# Patient Record
Sex: Male | Born: 1965 | Race: White | Hispanic: No | Marital: Married | Smoking: Never smoker
Health system: Southern US, Community
[De-identification: ages and names within clinical notes are randomized; demographics above are authoritative.]

## PROBLEM LIST (undated history)

## (undated) DIAGNOSIS — I10 Essential (primary) hypertension: Secondary | ICD-10-CM

## (undated) DIAGNOSIS — K589 Irritable bowel syndrome without diarrhea: Secondary | ICD-10-CM

## (undated) DIAGNOSIS — F449 Dissociative and conversion disorder, unspecified: Secondary | ICD-10-CM

## (undated) DIAGNOSIS — M722 Plantar fascial fibromatosis: Secondary | ICD-10-CM

## (undated) DIAGNOSIS — G4730 Sleep apnea, unspecified: Secondary | ICD-10-CM

## (undated) DIAGNOSIS — K219 Gastro-esophageal reflux disease without esophagitis: Secondary | ICD-10-CM

## (undated) DIAGNOSIS — F329 Major depressive disorder, single episode, unspecified: Secondary | ICD-10-CM

## (undated) DIAGNOSIS — K5792 Diverticulitis of intestine, part unspecified, without perforation or abscess without bleeding: Secondary | ICD-10-CM

## (undated) DIAGNOSIS — E785 Hyperlipidemia, unspecified: Secondary | ICD-10-CM

## (undated) DIAGNOSIS — I499 Cardiac arrhythmia, unspecified: Secondary | ICD-10-CM

## (undated) DIAGNOSIS — R55 Syncope and collapse: Secondary | ICD-10-CM

## (undated) DIAGNOSIS — G43909 Migraine, unspecified, not intractable, without status migrainosus: Secondary | ICD-10-CM

## (undated) DIAGNOSIS — J45909 Unspecified asthma, uncomplicated: Secondary | ICD-10-CM

## (undated) DIAGNOSIS — F419 Anxiety disorder, unspecified: Secondary | ICD-10-CM

## (undated) DIAGNOSIS — F32A Depression, unspecified: Secondary | ICD-10-CM

## (undated) HISTORY — DX: Plantar fascial fibromatosis: M72.2

## (undated) HISTORY — DX: Diverticulitis of intestine, part unspecified, without perforation or abscess without bleeding: K57.92

## (undated) HISTORY — PX: GALLBLADDER SURGERY: SHX652

## (undated) HISTORY — DX: Syncope and collapse: R55

## (undated) HISTORY — DX: Major depressive disorder, single episode, unspecified: F32.9

## (undated) HISTORY — DX: Anxiety disorder, unspecified: F41.9

## (undated) HISTORY — PX: COLON SURGERY: SHX602

## (undated) HISTORY — DX: Dissociative and conversion disorder, unspecified: F44.9

## (undated) HISTORY — DX: Essential (primary) hypertension: I10

## (undated) HISTORY — DX: Depression, unspecified: F32.A

## (undated) HISTORY — DX: Sleep apnea, unspecified: G47.30

## (undated) HISTORY — PX: URETHRA SURGERY: SHX824

## (undated) HISTORY — DX: Hyperlipidemia, unspecified: E78.5

## (undated) HISTORY — DX: Migraine, unspecified, not intractable, without status migrainosus: G43.909

---

## 2004-10-16 ENCOUNTER — Ambulatory Visit: Payer: Self-pay | Admitting: Internal Medicine

## 2005-01-20 ENCOUNTER — Emergency Department: Payer: Self-pay | Admitting: Emergency Medicine

## 2005-01-20 ENCOUNTER — Other Ambulatory Visit: Payer: Self-pay

## 2006-01-22 ENCOUNTER — Emergency Department: Payer: Self-pay | Admitting: Emergency Medicine

## 2006-01-27 ENCOUNTER — Emergency Department (HOSPITAL_COMMUNITY): Admission: EM | Admit: 2006-01-27 | Discharge: 2006-01-28 | Payer: Self-pay | Admitting: Emergency Medicine

## 2006-02-14 ENCOUNTER — Ambulatory Visit: Payer: Self-pay | Admitting: Psychiatry

## 2006-03-19 ENCOUNTER — Ambulatory Visit: Payer: Self-pay | Admitting: Psychiatry

## 2006-12-01 ENCOUNTER — Other Ambulatory Visit: Payer: Self-pay

## 2006-12-01 ENCOUNTER — Emergency Department: Payer: Self-pay | Admitting: Emergency Medicine

## 2007-01-08 ENCOUNTER — Other Ambulatory Visit: Payer: Self-pay

## 2007-01-08 ENCOUNTER — Emergency Department: Payer: Self-pay | Admitting: Internal Medicine

## 2007-03-31 ENCOUNTER — Emergency Department: Payer: Self-pay | Admitting: Emergency Medicine

## 2007-03-31 ENCOUNTER — Other Ambulatory Visit: Payer: Self-pay

## 2007-04-03 ENCOUNTER — Emergency Department: Payer: Self-pay | Admitting: Emergency Medicine

## 2007-04-03 ENCOUNTER — Other Ambulatory Visit: Payer: Self-pay

## 2007-04-10 ENCOUNTER — Ambulatory Visit: Payer: Self-pay | Admitting: Family Medicine

## 2007-04-16 ENCOUNTER — Ambulatory Visit: Payer: Self-pay | Admitting: Internal Medicine

## 2007-04-16 DIAGNOSIS — I10 Essential (primary) hypertension: Secondary | ICD-10-CM | POA: Insufficient documentation

## 2007-04-16 DIAGNOSIS — R059 Cough, unspecified: Secondary | ICD-10-CM | POA: Insufficient documentation

## 2007-04-16 DIAGNOSIS — R05 Cough: Secondary | ICD-10-CM

## 2007-06-04 ENCOUNTER — Emergency Department: Payer: Self-pay | Admitting: Emergency Medicine
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
## 2008-02-15 ENCOUNTER — Emergency Department: Payer: Self-pay | Admitting: Emergency Medicine

## 2008-03-05 ENCOUNTER — Emergency Department: Payer: Self-pay

## 2008-03-15 ENCOUNTER — Ambulatory Visit: Payer: Self-pay | Admitting: Gastroenterology

## 2008-03-25 ENCOUNTER — Ambulatory Visit: Payer: Self-pay | Admitting: Surgery

## 2008-03-31 ENCOUNTER — Inpatient Hospital Stay: Payer: Self-pay | Admitting: Surgery

## 2008-04-13 ENCOUNTER — Emergency Department: Payer: Self-pay | Admitting: Emergency Medicine

## 2008-09-27 ENCOUNTER — Observation Stay: Payer: Self-pay | Admitting: *Deleted

## 2008-10-13 ENCOUNTER — Ambulatory Visit: Payer: Self-pay | Admitting: Internal Medicine

## 2009-05-17 ENCOUNTER — Ambulatory Visit: Payer: Self-pay | Admitting: Unknown Physician Specialty

## 2009-05-18 ENCOUNTER — Ambulatory Visit: Payer: Self-pay | Admitting: Unknown Physician Specialty

## 2009-08-29 ENCOUNTER — Emergency Department: Payer: Self-pay | Admitting: Unknown Physician Specialty

## 2010-01-05 ENCOUNTER — Ambulatory Visit: Payer: Self-pay | Admitting: Unknown Physician Specialty

## 2010-01-06 LAB — PATHOLOGY REPORT

## 2010-09-29 ENCOUNTER — Emergency Department: Payer: Self-pay | Admitting: Emergency Medicine

## 2010-12-24 ENCOUNTER — Emergency Department: Payer: Self-pay | Admitting: *Deleted

## 2011-04-30 ENCOUNTER — Ambulatory Visit: Payer: Self-pay | Admitting: Unknown Physician Specialty

## 2011-05-03 ENCOUNTER — Ambulatory Visit: Payer: Self-pay | Admitting: Otolaryngology

## 2011-05-08 ENCOUNTER — Ambulatory Visit: Payer: Self-pay | Admitting: Surgery

## 2011-07-15 ENCOUNTER — Emergency Department: Payer: Self-pay | Admitting: Emergency Medicine

## 2011-11-06 ENCOUNTER — Emergency Department: Payer: Self-pay | Admitting: Emergency Medicine

## 2012-07-21 ENCOUNTER — Emergency Department: Payer: Self-pay | Admitting: Emergency Medicine

## 2012-07-21 LAB — URINALYSIS, COMPLETE
Bilirubin,UR: NEGATIVE
Nitrite: NEGATIVE
Specific Gravity: 1.021 (ref 1.003–1.030)

## 2012-07-21 LAB — BASIC METABOLIC PANEL
Anion Gap: 4 — ABNORMAL LOW (ref 7–16)
BUN: 10 mg/dL (ref 7–18)
Creatinine: 0.9 mg/dL (ref 0.60–1.30)
EGFR (African American): >60
Osmolality: 276 (ref 275–301)
Potassium: 4.1 mmol/L (ref 3.5–5.1)
Sodium: 139 mmol/L (ref 136–145)

## 2012-07-21 LAB — CBC
HCT: 44.7 % (ref 40.0–52.0)
HGB: 15.6 g/dL (ref 13.0–18.0)
MCH: 31.1 pg (ref 26.0–34.0)
MCHC: 34.8 g/dL (ref 32.0–36.0)
Platelet: 243 10*3/uL (ref 150–440)
RDW: 13.8 % (ref 11.5–14.5)

## 2012-12-21 ENCOUNTER — Observation Stay: Payer: Self-pay | Admitting: Internal Medicine

## 2012-12-21 LAB — COMPREHENSIVE METABOLIC PANEL
Albumin: 3.6 g/dL (ref 3.4–5.0)
Anion Gap: 4 — ABNORMAL LOW (ref 7–16)
BUN: 10 mg/dL (ref 7–18)
Calcium, Total: 9.1 mg/dL (ref 8.5–10.1)
Co2: 26 mmol/L (ref 21–32)
EGFR (Non-African Amer.): >60
Glucose: 111 mg/dL — ABNORMAL HIGH (ref 65–99)
SGOT(AST): 37 U/L (ref 15–37)
SGPT (ALT): 51 U/L (ref 12–78)
Sodium: 138 mmol/L (ref 136–145)
Total Protein: 7.4 g/dL (ref 6.4–8.2)

## 2012-12-21 LAB — URINALYSIS, COMPLETE
Bacteria: NONE SEEN
Bilirubin,UR: NEGATIVE
Blood: NEGATIVE
Ketone: NEGATIVE
Protein: NEGATIVE
RBC,UR: 1 /HPF (ref 0–5)
Specific Gravity: 1.016 (ref 1.003–1.030)
Squamous Epithelial: <1
WBC UR: 1 /HPF (ref 0–5)

## 2012-12-21 LAB — CBC
HGB: 14.7 g/dL (ref 13.0–18.0)
MCHC: 34.7 g/dL (ref 32.0–36.0)
MCV: 89 fL (ref 80–100)
RBC: 4.72 10*6/uL (ref 4.40–5.90)
RDW: 13.9 % (ref 11.5–14.5)
WBC: 8.1 10*3/uL (ref 3.8–10.6)

## 2012-12-21 LAB — DRUG SCREEN, URINE
Benzodiazepine, Ur Scrn: NEGATIVE (ref ?–200)
Cannabinoid 50 Ng, Ur ~~LOC~~: NEGATIVE (ref ?–50)
MDMA (Ecstasy)Ur Screen: NEGATIVE (ref ?–500)
Methadone, Ur Screen: NEGATIVE (ref ?–300)
Opiate, Ur Screen: NEGATIVE (ref ?–300)
Tricyclic, Ur Screen: NEGATIVE (ref ?–1000)

## 2012-12-21 LAB — TSH: Thyroid Stimulating Horm: 2.88 u[IU]/mL

## 2012-12-21 LAB — TROPONIN I: Troponin-I: <0.02 ng/mL

## 2012-12-22 LAB — CBC WITH DIFFERENTIAL/PLATELET
Basophil #: 0 10*3/uL (ref 0.0–0.1)
Basophil %: 0.2 %
Eosinophil #: 0 10*3/uL (ref 0.0–0.7)
HCT: 42.5 % (ref 40.0–52.0)
HGB: 14.7 g/dL (ref 13.0–18.0)
Lymphocyte #: 0.9 10*3/uL — ABNORMAL LOW (ref 1.0–3.6)
Lymphocyte %: 6.1 %
MCH: 31 pg (ref 26.0–34.0)
MCHC: 34.5 g/dL (ref 32.0–36.0)
MCV: 90 fL (ref 80–100)
Monocyte #: 0.1 x10 3/mm — ABNORMAL LOW (ref 0.2–1.0)
Neutrophil #: 13.5 10*3/uL — ABNORMAL HIGH (ref 1.4–6.5)
Neutrophil %: 92.8 %
Platelet: 233 10*3/uL (ref 150–440)

## 2012-12-22 LAB — LIPID PANEL
Cholesterol: 186 mg/dL (ref 0–200)
HDL Cholesterol: 51 mg/dL (ref 40–60)
Triglycerides: 96 mg/dL (ref 0–200)
VLDL Cholesterol, Calc: 19 mg/dL (ref 5–40)

## 2012-12-22 LAB — BASIC METABOLIC PANEL
Calcium, Total: 9 mg/dL (ref 8.5–10.1)
Chloride: 104 mmol/L (ref 98–107)
Co2: 24 mmol/L (ref 21–32)
Creatinine: 1.02 mg/dL (ref 0.60–1.30)
EGFR (African American): >60
Osmolality: 273 (ref 275–301)
Potassium: 4.4 mmol/L (ref 3.5–5.1)
Sodium: 135 mmol/L — ABNORMAL LOW (ref 136–145)

## 2012-12-22 LAB — TROPONIN I: Troponin-I: <0.02 ng/mL

## 2012-12-24 LAB — SODIUM: Sodium: 137 mmol/L (ref 136–145)

## 2013-01-01 ENCOUNTER — Encounter: Payer: Self-pay | Admitting: Internal Medicine

## 2013-01-03 ENCOUNTER — Encounter: Payer: Self-pay | Admitting: Internal Medicine

## 2013-02-02 ENCOUNTER — Encounter: Payer: Self-pay | Admitting: Internal Medicine

## 2013-02-28 ENCOUNTER — Emergency Department: Payer: Self-pay | Admitting: Emergency Medicine

## 2013-03-04 ENCOUNTER — Ambulatory Visit: Payer: Self-pay | Admitting: Specialist

## 2013-03-05 ENCOUNTER — Encounter: Payer: Self-pay | Admitting: Internal Medicine

## 2013-04-05 ENCOUNTER — Encounter: Payer: Self-pay | Admitting: Internal Medicine

## 2013-05-03 ENCOUNTER — Encounter: Payer: Self-pay | Admitting: Internal Medicine

## 2013-05-10 ENCOUNTER — Emergency Department: Payer: Self-pay | Admitting: Emergency Medicine

## 2013-05-10 LAB — COMPREHENSIVE METABOLIC PANEL
ANION GAP: 8 (ref 7–16)
Albumin: 3.5 g/dL (ref 3.4–5.0)
Alkaline Phosphatase: 82 U/L
BILIRUBIN TOTAL: 0.4 mg/dL (ref 0.2–1.0)
BUN: 10 mg/dL (ref 7–18)
CHLORIDE: 107 mmol/L (ref 98–107)
Calcium, Total: 8.4 mg/dL — ABNORMAL LOW (ref 8.5–10.1)
Co2: 24 mmol/L (ref 21–32)
Creatinine: 0.99 mg/dL (ref 0.60–1.30)
EGFR (African American): >60
EGFR (Non-African Amer.): >60
GLUCOSE: 133 mg/dL — AB (ref 65–99)
Osmolality: 279 (ref 275–301)
POTASSIUM: 3.4 mmol/L — AB (ref 3.5–5.1)
SGOT(AST): 22 U/L (ref 15–37)
SGPT (ALT): 38 U/L (ref 12–78)
Sodium: 139 mmol/L (ref 136–145)
TOTAL PROTEIN: 7.3 g/dL (ref 6.4–8.2)

## 2013-05-10 LAB — TROPONIN I: Troponin-I: <0.02 ng/mL

## 2013-05-10 LAB — CBC WITH DIFFERENTIAL/PLATELET
Basophil #: 0.1 10*3/uL (ref 0.0–0.1)
Basophil %: 1.2 %
Eosinophil #: 0.3 10*3/uL (ref 0.0–0.7)
Eosinophil %: 3.9 %
HCT: 44 % (ref 40.0–52.0)
HGB: 14.7 g/dL (ref 13.0–18.0)
Lymphocyte #: 2 10*3/uL (ref 1.0–3.6)
Lymphocyte %: 23.5 %
MCH: 29.7 pg (ref 26.0–34.0)
MCHC: 33.3 g/dL (ref 32.0–36.0)
MCV: 89 fL (ref 80–100)
Monocyte #: 0.7 x10 3/mm (ref 0.2–1.0)
Monocyte %: 8.7 %
NEUTROS ABS: 5.4 10*3/uL (ref 1.4–6.5)
NEUTROS PCT: 62.7 %
Platelet: 219 10*3/uL (ref 150–440)
RBC: 4.94 10*6/uL (ref 4.40–5.90)
RDW: 13.4 % (ref 11.5–14.5)
WBC: 8.6 10*3/uL (ref 3.8–10.6)

## 2013-05-10 LAB — PROTIME-INR
INR: 1
Prothrombin Time: 13.4 secs (ref 11.5–14.7)

## 2013-05-10 LAB — APTT: ACTIVATED PTT: 29.2 s (ref 23.6–35.9)

## 2013-06-03 ENCOUNTER — Encounter: Payer: Self-pay | Admitting: Internal Medicine

## 2013-06-12 DIAGNOSIS — E785 Hyperlipidemia, unspecified: Secondary | ICD-10-CM | POA: Insufficient documentation

## 2013-06-12 DIAGNOSIS — M722 Plantar fascial fibromatosis: Secondary | ICD-10-CM | POA: Insufficient documentation

## 2013-06-12 DIAGNOSIS — M545 Low back pain, unspecified: Secondary | ICD-10-CM | POA: Insufficient documentation

## 2013-09-14 DIAGNOSIS — F419 Anxiety disorder, unspecified: Secondary | ICD-10-CM | POA: Insufficient documentation

## 2013-09-14 DIAGNOSIS — F332 Major depressive disorder, recurrent severe without psychotic features: Secondary | ICD-10-CM | POA: Insufficient documentation

## 2013-09-14 DIAGNOSIS — G4730 Sleep apnea, unspecified: Secondary | ICD-10-CM | POA: Insufficient documentation

## 2013-09-22 ENCOUNTER — Emergency Department: Payer: Self-pay | Admitting: Emergency Medicine

## 2013-09-22 LAB — BASIC METABOLIC PANEL
Anion Gap: 8 (ref 7–16)
BUN: 12 mg/dL (ref 7–18)
CALCIUM: 8.7 mg/dL (ref 8.5–10.1)
Chloride: 105 mmol/L (ref 98–107)
Co2: 24 mmol/L (ref 21–32)
Creatinine: 1.06 mg/dL (ref 0.60–1.30)
EGFR (Non-African Amer.): >60
Glucose: 105 mg/dL — ABNORMAL HIGH (ref 65–99)
Osmolality: 274 (ref 275–301)
Potassium: 3.9 mmol/L (ref 3.5–5.1)
Sodium: 137 mmol/L (ref 136–145)

## 2013-09-22 LAB — CBC WITH DIFFERENTIAL/PLATELET
BASOS PCT: 0.8 %
Basophil #: 0.1 10*3/uL (ref 0.0–0.1)
Eosinophil #: 0.2 10*3/uL (ref 0.0–0.7)
Eosinophil %: 2.8 %
HCT: 47.7 % (ref 40.0–52.0)
HGB: 16.3 g/dL (ref 13.0–18.0)
Lymphocyte #: 1.4 10*3/uL (ref 1.0–3.6)
Lymphocyte %: 16.6 %
MCH: 30.7 pg (ref 26.0–34.0)
MCHC: 34.2 g/dL (ref 32.0–36.0)
MCV: 90 fL (ref 80–100)
Monocyte #: 0.6 x10 3/mm (ref 0.2–1.0)
Monocyte %: 7.3 %
NEUTROS PCT: 72.5 %
Neutrophil #: 6.1 10*3/uL (ref 1.4–6.5)
PLATELETS: 212 10*3/uL (ref 150–440)
RBC: 5.31 10*6/uL (ref 4.40–5.90)
RDW: 13.8 % (ref 11.5–14.5)
WBC: 8.4 10*3/uL (ref 3.8–10.6)

## 2013-09-22 LAB — TROPONIN I

## 2013-10-05 ENCOUNTER — Encounter (INDEPENDENT_AMBULATORY_CARE_PROVIDER_SITE_OTHER): Payer: Self-pay

## 2013-10-05 ENCOUNTER — Encounter: Payer: Self-pay | Admitting: Cardiovascular Disease

## 2013-10-05 ENCOUNTER — Ambulatory Visit (INDEPENDENT_AMBULATORY_CARE_PROVIDER_SITE_OTHER): Payer: 59 | Admitting: Cardiovascular Disease

## 2013-10-05 VITALS — BP 120/92 | HR 94 | Ht 72.0 in | Wt 256.8 lb

## 2013-10-05 DIAGNOSIS — R519 Headache, unspecified: Secondary | ICD-10-CM | POA: Insufficient documentation

## 2013-10-05 DIAGNOSIS — F449 Dissociative and conversion disorder, unspecified: Secondary | ICD-10-CM

## 2013-10-05 DIAGNOSIS — R55 Syncope and collapse: Secondary | ICD-10-CM

## 2013-10-05 DIAGNOSIS — R51 Headache: Secondary | ICD-10-CM

## 2013-10-05 DIAGNOSIS — F329 Major depressive disorder, single episode, unspecified: Secondary | ICD-10-CM

## 2013-10-05 DIAGNOSIS — F32A Depression, unspecified: Secondary | ICD-10-CM | POA: Insufficient documentation

## 2013-10-05 DIAGNOSIS — I158 Other secondary hypertension: Secondary | ICD-10-CM

## 2013-10-05 DIAGNOSIS — F3289 Other specified depressive episodes: Secondary | ICD-10-CM

## 2013-10-05 DIAGNOSIS — I10 Essential (primary) hypertension: Secondary | ICD-10-CM

## 2013-10-05 DIAGNOSIS — G44019 Episodic cluster headache, not intractable: Secondary | ICD-10-CM

## 2013-10-05 DIAGNOSIS — F444 Conversion disorder with motor symptom or deficit: Secondary | ICD-10-CM | POA: Insufficient documentation

## 2013-10-05 NOTE — Assessment & Plan Note (Signed)
Suspect his depression may be playing a role in his symptoms. He is on Celexa, Wellbutrin. Seems to have good supports

## 2013-10-05 NOTE — Assessment & Plan Note (Signed)
Suspect his headaches could be associated with underlying depression. Does not seem to have worsening symptoms after his syncope. He has followup with neurology

## 2013-10-05 NOTE — Assessment & Plan Note (Signed)
Details of his acute right-sided weakness unclear from last year in October 2014. 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 Previously seen by Dr. Melrose Nakayama, complete workup done at that time in the hospital. By his report he had MRI done that did not show stroke.

## 2013-10-05 NOTE — Progress Notes (Signed)
Patient ID: Arthur Howe, male    DOB: Jul 30, 1965, 48 y.o.   MRN: [id number]  HPI Comments: Arthur Howe is a pleasant 48 year old gentleman with prior history of diverticulitis with associated near syncope episodes, obstructive sleep apnea who is on CPAP, hypertension, hyperlipidemia, anxiety, depression with periodic migraines who presents for new patient evaluation for syncope.  Notes indicate recent symptoms of poor mood, chronic fatigue, periodic headaches  Get an episode in October 2014 where he had acute onset of right-sided weakness. He was seen by neurology and diagnosed with conversion disorder. He reports having MRI at the time that did not document a stroke. He feels that he continues to have mild residual symptoms.  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 Difficulty started several weeks ago. He used to be 90% accurate with his reading. Recently has been getting scores in the 70s which is unlike him  09/21/2013 reports working in his garden, stood up to walk into the house. Once in the house he had syncope without warning. Found himself on the ground. He did not hurt himself in one particular location, did not hit his head as he had no significant bruising. He called his wife, lie on the floor. Blood pressure is 196/130. He felt weak. Wife helped him to lay on the couch, he took Ativan and slowly his blood pressure improved.  He has general malaise the next day and went to the hospital emergency room July 21. Workup was essentially normal and he was referred to neurology For several days he had fogginess EKG today shows normal sinus rhythm with rate 94 beats per minute, no significant ST or T wave changes   reports having echocardiogram this past year that was reportedly normal [facility] telemetry office that showed systolic pressures 338/25 range, heart rate 90-100 with no significant change with standing. Interestingly he felt  dizzy sitting and standing        Outpatient Encounter Prescriptions as of 10/05/2013  Medication Sig  . albuterol (PROVENTIL HFA;VENTOLIN HFA) 108 (90 BASE) MCG/ACT inhaler Inhale into the lungs every 6 (six) hours as needed for wheezing or shortness of breath.  . ALPRAZolam (XANAX) 0.25 MG tablet Take 0.25 mg by mouth at bedtime as needed for anxiety.  Marland Kitchen buPROPion (WELLBUTRIN SR) 100 MG 12 hr tablet Take 100 mg by mouth 2 (two) times daily.  Marland Kitchen BUTALBITAL-ACETAMINOPHEN PO Take by mouth as needed.  . citalopram (CELEXA) 10 MG tablet Take 10 mg by mouth daily.  . clonazePAM (KLONOPIN) 0.5 MG tablet Take 0.5 mg by mouth 2 (two) times daily as needed for anxiety.  . dicyclomine (BENTYL) 10 MG capsule Take 10 mg by mouth 4 (four) times daily -  before meals and at bedtime.  . fexofenadine (ALLEGRA) 60 MG tablet Take 60 mg by mouth 2 (two) times daily.  Marland Kitchen ibuprofen (ADVIL,MOTRIN) 200 MG tablet Take 400 mg by mouth every 6 (six) hours as needed.  Marland Kitchen losartan (COZAAR) 100 MG tablet Take 100 mg by mouth daily.  . meclizine (ANTIVERT) 25 MG tablet Take 25 mg by mouth 3 (three) times daily.   . mometasone (NASONEX) 50 MCG/ACT nasal spray Place 2 sprays into the nose daily.  . Multiple Vitamin (MULTI VITAMIN DAILY PO) Take by mouth daily.  . nortriptyline (PAMELOR) 10 MG capsule Take 10 mg by mouth at bedtime.  Marland Kitchen omeprazole (PRILOSEC) 20 MG capsule Take 20 mg by mouth daily.  . promethazine (PHENERGAN)  25 MG tablet Take 25 mg by mouth every 6 (six) hours as needed for nausea or vomiting.   Review of Systems  Constitutional: Positive for fever.  HENT: Negative.   Eyes: Negative.   Respiratory: Negative.   Cardiovascular: Negative.   Gastrointestinal: Negative.   Endocrine: Negative.   Musculoskeletal: Negative.   Skin: Negative.   Allergic/Immunologic: Negative.   Neurological: Positive for syncope and weakness.  Hematological: Negative.   Psychiatric/Behavioral: Positive for dysphoric mood  and decreased concentration. The patient is nervous/anxious.   All other systems reviewed and are negative.   BP 120/92  Pulse 94  Ht 6' (1.829 m)  Wt 256 lb 12 oz (116.461 kg)  BMI 34.81 kg/m2  Physical Exam  Nursing note and vitals reviewed. Constitutional: He is oriented to person, place, and time. He appears well-developed and well-nourished.  HENT:  Head: Normocephalic.  Nose: Nose normal.  Mouth/Throat: Oropharynx is clear and moist.  Eyes: Conjunctivae are normal. Pupils are equal, round, and reactive to light.  Neck: Normal range of motion. Neck supple. No JVD present.  Cardiovascular: Normal rate, regular rhythm, S1 normal, S2 normal, normal heart sounds and intact distal pulses.  Exam reveals no gallop and no friction rub.   No murmur heard. Pulmonary/Chest: Effort normal and breath sounds normal. No respiratory distress. He has no wheezes. He has no rales. He exhibits no tenderness.  Abdominal: Soft. Bowel sounds are normal. He exhibits no distension. There is no tenderness.  Musculoskeletal: Normal range of motion. He exhibits no edema and no tenderness.  Lymphadenopathy:    He has no cervical adenopathy.  Neurological: He is alert and oriented to person, place, and time. Coordination normal.  Skin: Skin is warm and dry. No rash noted. No erythema.  Psychiatric: He has a normal mood and affect. His behavior is normal. Judgment and thought content normal.      Assessment and Plan

## 2013-10-05 NOTE — Patient Instructions (Signed)
You are doing well. No medication changes were made.  We will order a 48 hour holter monitor for syncope We will call you with the results  If you have moe syncope/pass out spells, please call the office  Please call us if you have new issues that need to be addressed before your next appt.

## 2013-10-05 NOTE — Assessment & Plan Note (Signed)
Blood pressure is well controlled on today's visit. No changes made to the medications. 

## 2013-10-05 NOTE — Assessment & Plan Note (Addendum)
Hospital records were reviewed from his trip to the emergency room 09/22/2013. Primary care records also reviewed. We'll try to obtain his prior echocardiogram. EKG and clinical exam is essentially benign.  We did obtain a prior stress echocardiogram done in 2009 that showed no ischemia. There was decreased exercise tolerance with asthma-type symptoms. He walked for less than 9 minutes, achieved 10 METS.  Unable to exclude arrhythmia and a 48 hour Holter monitor has been ordered. Symptoms did not suggest ischemia and no further testing has been ordered. If he has additional episodes of near syncope or syncope, a 30 day monitor could be ordered.   Unable to exclude a neurologic issue. He has followup with Dr. Melrose Nakayama.

## 2013-10-07 ENCOUNTER — Telehealth: Payer: Self-pay | Admitting: *Deleted

## 2013-10-07 NOTE — Telephone Encounter (Signed)
Patient called and hasn't received his heart monitor. Please call

## 2013-10-07 NOTE — Telephone Encounter (Signed)
Spoke w/ pt.  He states that he has not heard from [facility] regarding his 48 hr holter.  Gave him # to Margaretha Sheffield in case he has not heard anything tomorrow.

## 2013-10-09 NOTE — Telephone Encounter (Signed)
Spoke with patient regarding [facility] monitor and he will have placed on Monday.

## 2013-10-12 ENCOUNTER — Telehealth: Payer: Self-pay | Admitting: *Deleted

## 2013-10-12 DIAGNOSIS — R55 Syncope and collapse: Secondary | ICD-10-CM

## 2013-10-12 NOTE — Telephone Encounter (Signed)
Heart monitor

## 2013-10-13 ENCOUNTER — Telehealth: Payer: Self-pay

## 2013-10-13 NOTE — Telephone Encounter (Signed)
Pt called states he has a change in symptoms, speech is messed up, and confused.

## 2013-10-13 NOTE — Telephone Encounter (Signed)
Spoke w/ pt.  He states that his BP is 185/110 and his speech has been slurred for the past 2 days. Pt's speech is audilby slow and slurred.  Advised pt to hang up the phone and call 911. He verbalizes understanding and will do so now.

## 2013-10-15 DIAGNOSIS — G43909 Migraine, unspecified, not intractable, without status migrainosus: Secondary | ICD-10-CM | POA: Insufficient documentation

## 2013-10-15 DIAGNOSIS — K219 Gastro-esophageal reflux disease without esophagitis: Secondary | ICD-10-CM | POA: Insufficient documentation

## 2013-10-26 ENCOUNTER — Telehealth: Payer: Self-pay

## 2013-10-26 NOTE — Telephone Encounter (Signed)
Reviewed results of pt's recent holter monitor.  He verbalizes understanding, but would like to know what the next step is.  He asks if he should be referred to another type of specialist or if there are more "heart tests" that can be done.  Please advise.   Thank you.

## 2013-10-26 NOTE — Telephone Encounter (Signed)
Left detailed message on pt's vm w/ holter monitor results:  "NSR, rare APC, PVC"  Asked pt to call back w/ any questions or concerns.

## 2013-10-26 NOTE — Telephone Encounter (Signed)
Notes indicate he had a prior echocardiogram Can we try to obtain that for our records  Would suggest he continue followup with Dr. Jerline Pain of neurology  If he has additional symptoms of near syncope or syncope that are not explained, We could order a 30 day monitor At this point, will need to watch him for additional symptoms. No medication changes needed Would monitor the blood pressure at home

## 2013-10-27 NOTE — Telephone Encounter (Signed)
Spoke w/ pt.  Advised him of Dr. Donivan Scull recommendation.  He verbalizes understanding and will call w/ any further questions or concerns.

## 2013-11-05 ENCOUNTER — Other Ambulatory Visit: Payer: Self-pay

## 2013-11-05 ENCOUNTER — Encounter (INDEPENDENT_AMBULATORY_CARE_PROVIDER_SITE_OTHER): Payer: 59

## 2013-11-05 DIAGNOSIS — R55 Syncope and collapse: Secondary | ICD-10-CM

## 2014-06-25 NOTE — H&P (Signed)
PATIENT NAME:  Arthur Howe, Arthur Howe MR#:  [id number] DATE OF BIRTH:  1965/10/10  DATE OF ADMISSION:  12/21/2012  PRIMARY CARE PHYSICIAN:  Youlanda Roys. Lovie Macadamia, MD  CHIEF COMPLAINT: Right-sided weakness.   HISTORY OF PRESENT ILLNESS: This is a 49 year old man who, on Friday, thought he was having an anxiety attack; had chest pain, shortness of breath.  His neck was hurting and had involuntary spasms, some weakness on the legs, mostly right side. Yesterday, he was walking a little bit better but still had the weakness. He had some slurred speech on Friday night and difficulty getting out his thoughts. Today, still having right-sided weakness. He drove to work. He could not remember the alarm code at work. He could not get his hands to work right. He was typing in wrong numbers, making mistakes mostly with his right hand. Two weeks ago, he was on an antibiotic, an inhaler and a cough medication but no other recent medications. In the ER, he did have right-sided weakness. He had a CT scan of the head that was negative. Hospitalist services were contacted for admission.   PAST MEDICAL HISTORY: Anxiety, hypertension, diverticulitis, sleep apnea.   PAST SURGICAL HISTORY: Cholecystectomy and colon surgery.   ALLERGIES: PENICILLIN.   MEDICATIONS: Still working on that list.  He does take Prozac and a blood pressure medication and a multivitamin.   SOCIAL HISTORY: No smoking. No alcohol. No drug use. Works at [employer].   FAMILY HISTORY: Father with seizures. Mother with diverticulitis. Grandparents with heart disease.   REVIEW OF SYSTEMS: CONSTITUTIONAL: Positive for chills. Positive for sweats. Positive for weight gain, 10 pounds the past month. Positive for fatigue, weakness on the right side.  EYES:  Bilateral eye blurry vision, cannot see close.  EARS, NOSE, MOUTH AND THROAT:  Positive for runny nose. Positive for scratchy throat.  CARDIOVASCULAR: Positive for chest pain on and off for the past 3  days.  RESPIRATORY: Positive for shortness of breath, cough, yellow phlegm.  GASTROINTESTINAL: No nausea. No vomiting. No abdominal pain. No diarrhea. No constipation.  GENITOURINARY: Positive for burning on urination. No hematuria.  MUSCULOSKELETAL: Positive for joint pain all over.  INTEGUMENT: No rashes or eruptions.  NEUROLOGIC: Positive for right-sided weakness, unsteady gait.  PSYCHIATRIC: Positive for anxiety.  ENDOCRINE: No thyroid problems.  HEMATOLOGIC AND LYMPHATIC: No anemia. No easy bruising or bleeding.   PHYSICAL EXAMINATION: VITAL SIGNS: Temperature 98.3, pulse 89, respirations 18, blood pressure 192/107, pulse ox 96% on room air.  GENERAL: No respiratory distress.  EYES: Conjunctivae and lids normal. Pupils equal, round and reactive to light. Extraocular muscles intact. No nystagmus.  EARS, NOSE, MOUTH AND THROAT: Tympanic membranes: No erythema. Nasal mucosa: No erythema. Throat: No erythema. No exudate seen. Lips and gums: No lesions.  NECK: No JVD. No bruits. No lymphadenopathy. No thyromegaly. Positive for paraspinal muscle tenderness bilaterally. No pain over the cervical spine with palpation.  CARDIOVASCULAR: S1, S2 normal. No gallops, rubs or murmurs heard. Carotid upstroke 2+ bilaterally. No bruits. Dorsalis pedis pulses 2+ bilaterally. Trace edema of the lower extremity.  ABDOMEN: Soft, nontender. No organomegaly/splenomegaly. Normoactive bowel sounds. No masses felt.  RESPIRATORY: Positive expiratory wheeze, slight. No use of accessory muscles to breathe. No rhonchi or rales heard.  LYMPHATIC: No lymph nodes in the neck.  MUSCULOSKELETAL: Trace edema. No clubbing. No cyanosis.  SKIN: No ulcers or lesions seen.  NEUROLOGIC: Cranial nerves II through XII grossly intact. Reflexes 2+ bilaterally. Sensation right side to light touch decreased by  my exam. Power 5 out of 5 on the left, 4 out of 5 power right lower extremity and right upper extremity.  PSYCHIATRIC: The  patient is alert and oriented to person, place and time.   LABORATORY AND RADIOLOGICAL DATA: CT scan of the head was negative. White blood cell count 8.1, H and H 14.7 and 42.2, platelet count of 205. Glucose 111, BUN 10, creatinine 0.84, sodium 138, potassium 4.2, chloride 108, CO2 26, calcium 9.1. Liver function tests normal range. Urinalysis negative. TSH 2.88. Troponin negative.   ASSESSMENT AND PLAN: 1.  Right-sided weakness. Will admit as an observation, rule out cerebrovascular accident and brain tumor with an MRI of the brain. Since the patient also had some neck symptoms, I will MRI of the cervical spine also and give empiric steroids. I will give an aspirin 325 mg daily for stroke prevention. I will get physical therapy and occupational therapy evaluations. Continue to monitor based on clinical signs. Further recommendations based on radiological studies.  2.  Cough. The patient had recent antibiotic course. I will start empiric steroids and nebulizers. I will order a chest x-ray to rule out pneumonia.  3.  Malignant hypertension. Will give a stat dose of clonidine and continue to monitor closely.  4.  Sleep apnea. Continue CPAP at night.  5.  Anxiety. We will continue Prozac and the patient's Xanax, just trying to confirm dosages.   TIME SPENT ON ADMISSION: 55 minutes.     ____________________________ Tana Conch. Leslye Peer, MD rjw:cs D: 12/21/2012 14:53:11 ET T: 12/21/2012 15:38:14 ET JOB#: [id number]  cc: Tana Conch. Leslye Peer, MD, <Dictator> Youlanda Roys. Lovie Macadamia, MD Marisue Brooklyn MD ELECTRONICALLY SIGNED 12/22/2012 10:19

## 2014-06-25 NOTE — Consult Note (Signed)
Referring Physician:  Loletha Grayer :   Primary Care Physician:  Loletha Grayer : [facility] [place], [facility], [address] [place], [place] [address], [phone]  Reason for Consult: Admit Date: 21-Dec-2012  Chief Complaint: R sided weakness  Reason for Consult: CVA   History of Present Illness: History of Present Illness:   49 yo RHD M presents to [facility] with new onset of R sided weakness and difficulty getting his words out that started 4 days ago.  Pt went to work 3 days ago and was noted to be typing the wrong things.  He was very confused per wife and unable to get words out.  He did have a period of incontinence.  He still has some R sided weakness and speech is better.  He has never had an episode like this.  there was no tongue biting or shaking episode but there was a period where pt was confused.  Pt has remote hx of head trauma.  ROS:  General denies complaints   HEENT no complaints   Lungs no complaints   Cardiac no complaints   GI nausea   GU no complaints   Musculoskeletal no complaints   Extremities no complaints   Skin no complaints   Neuro numbness/tingling   Endocrine no complaints   Psych anxiety  depression   Past Medical/Surgical Hx:  GERD - Esophageal Reflux:   Gastritis:   Heart Murmer:   MRSA:   Depression:   Anxiety:   Migraines:   IBS:   Diverticulitis:   HTN:   Cholecystectomy:   EGD:   Colonoscopy:   Repair of Urethral Stricture:   Laparoscopic assisted sigmoid colectomy.:   Past Medical/ Surgical Hx:  Past Medical History as above   Past Surgical History as above   Home Medications: Medication Instructions Last Modified Date/Time  Skelaxin 800 mg oral tablet 1 tab(s) orally 3 times a day, As Needed 21-Oct-14 14:02  predniSONE 10 mg oral tablet 5 tab(s) orally once a day x 1 days 4 tab(s) orally once a day x 1 days 3 tab(s) orally once a day x 1 days 2 tab(s) orally once a day x 1  days 1 tab(s) orally once a day x 1 days 21-Oct-14 14:02  lisinopril 5 mg oral tablet 1 tab(s) orally once a day 21-Oct-14 14:02  omeprazole 20 mg oral delayed release capsule 1 cap(s) orally 2 times a day 20-Oct-14 09:48  alprazolam 0.25 mg oral tablet 1 tab(s) orally 2 times a day, As Needed - for Anxiety, Nervousness 20-Oct-14 09:48  FLUoxetine 20 mg oral capsule 1 cap(s) orally once a day 20-Oct-14 09:48  multivitamin 1 tab(s) orally once a day 20-Oct-14 09:48  promethazine 25 mg oral tablet 1 tab(s) orally every 6 hours as needed for nausea, vomiting. 20-Oct-14 09:48  Ventolin HFA CFC free 90 mcg/inh inhalation aerosol 2 puff(s) inhaled 4 times a day as needed for shortness of breath/ wheezing.  20-Oct-14 09:48   Allergies:  PCN: Swelling, Other  Amoxicillin: Swelling  Allergies:  Allergies as above   Social/Family History: Employment Status: currently employed; [employer]  Lives With: spouse  Living Arrangements: house  Social History: no tob, no EtOH, no illicits  Family History: + epilepsy in father, no stroke   Vital Signs: **Vital Signs.:   21-Oct-14 14:08  Vital Signs Type Q 4hr  Temperature Temperature (F) 98.1  Celsius 36.7  Temperature Source oral  Pulse Pulse 86  Respirations Respirations 18  Systolic BP Systolic BP 591  Diastolic BP (mmHg) Diastolic BP (mmHg) 638  Mean BP 121  Pulse Ox % Pulse Ox % 93  Pulse Ox Activity Level  At rest  Oxygen Delivery Room Air/ 21 %   Physical Exam: General: slightly overweight, slightly anxious,  HEENT: normocephalic, sclera nonicteric, oropharynx clear  Neck: supple, no JVD, no bruits  Chest: CTA B, no wheezing, good movement  Cardiac: RRR, no murmurs, no edema, 2+ pulses  Extremities: no C/C/E, FROM   Neurologic Exam: Mental Status: alert and oriented x 3, normal speech and language, follows complex commands  Cranial Nerves: PERRLA, EOMI, nl VF, face symmetric, tongue midline, shoulder shrug equal  Motor  Exam: 5/5 B with mild R UE drift, normal tone, high frequency tremor  Deep Tendon Reflexes: 2+/4 B, plantars downgoing B, no Hoffman  Sensory Exam: decreased pin and temp on R hemibody, no splitting of midline  Coordination: FTN and HTS WNL, nl RAM   Lab Results: Thyroid:  19-Oct-14 10:53   Thyroid Stimulating Hormone 2.88 (0.45-4.50 (International Unit)  ----------------------- Pregnant patients have  different reference  ranges for TSH:  - - - - - - - - - -  Pregnant, first trimetser:  0.36 - 2.50 uIU/mL)  Hepatic:  19-Oct-14 10:53   Bilirubin, Total 0.5  Alkaline Phosphatase 85  SGPT (ALT) 51  SGOT (AST) 37  Total Protein, Serum 7.4  Albumin, Serum 3.6  Routine Chem:  20-Oct-14 02:58   Glucose, Serum  154  BUN 12  Creatinine (comp) 1.02  Sodium, Serum  135  Potassium, Serum 4.4  Chloride, Serum 104  CO2, Serum 24  Calcium (Total), Serum 9.0  Anion Gap 7  Osmolality (calc) 273  eGFR (African American) >60  eGFR (Non-African American) >60 (eGFR values <14m/min/1.73 m2 may be an indication of chronic kidney disease (CKD). Calculated eGFR is useful in patients with stable renal function. The eGFR calculation will not be reliable in acutely ill patients when serum creatinine is changing rapidly. It is not useful in  patients on dialysis. The eGFR calculation may not be applicable to patients at the low and high extremes of body sizes, pregnant women, and vegetarians.)  Cholesterol, Serum 186  Triglycerides, Serum 96  HDL (INHOUSE) 51  VLDL Cholesterol Calculated 19  LDL Cholesterol Calculated  116 (Result(s) reported on 22 Dec 2012 at 04:20AM.)  Urine Drugs:  [id number]  Tricyclic Antidepressant, Ur Qual (comp) NEGATIVE (Result(s) reported on 21 Dec 2012 at 03:35PM.)  Amphetamines, Urine Qual. NEGATIVE  MDMA, Urine Qual. NEGATIVE  Cocaine Metabolite, Urine Qual. NEGATIVE  Opiate, Urine qual NEGATIVE  Phencyclidine, Urine Qual. NEGATIVE  Cannabinoid,  Urine Qual. NEGATIVE  Barbiturates, Urine Qual. NEGATIVE  Benzodiazepine, Urine Qual. NEGATIVE (----------------- The URINE DRUG SCREEN provides only a preliminary, unconfirmed analytical test result and should not be used for non-medical  purposes.  Clinical consideration and professional judgment should be  applied to any positive drug screen result due to possible interfering substances.  A more specific alternate chemical method must be used in order to obtain a confirmed analytical result.  Gas chromatography/mass spectrometry (GC/MS) is the preferred confirmatory method.)  Methadone, Urine Qual. NEGATIVE  Cardiac:  20-Oct-14 02:58   Troponin I < 0.02 (0.00-0.05 0.05 ng/mL or less: NEGATIVE  Repeat testing in 3-6 hrs  if clinically indicated. >0.05 ng/mL: POTENTIAL  MYOCARDIAL INJURY. Repeat  testing in 3-6 hrs if  clinically indicated. NOTE: An increase or decrease  of 30% or more  on serial  testing suggests a  clinically important change)  Routine UA:  19-Oct-14 10:53   Color (UA) Yellow  Clarity (UA) Clear  Glucose (UA) Negative  Bilirubin (UA) Negative  Ketones (UA) Negative  Specific Gravity (UA) 1.016  Blood (UA) Negative  pH (UA) 6.0  Protein (UA) Negative  Nitrite (UA) Negative  Leukocyte Esterase (UA) Negative (Result(s) reported on 21 Dec 2012 at 11:48AM.)  RBC (UA) 1 /HPF  WBC (UA) 1 /HPF  Bacteria (UA) NONE SEEN  Epithelial Cells (UA) <1 /HPF (Result(s) reported on 21 Dec 2012 at 11:48AM.)  Routine Hem:  20-Oct-14 02:58   WBC (CBC)  14.6  RBC (CBC) 4.74  Hemoglobin (CBC) 14.7  Hematocrit (CBC) 42.5  Platelet Count (CBC) 233  MCV 90  MCH 31.0  MCHC 34.5  RDW 13.9  Neutrophil % 92.8  Lymphocyte % 6.1  Monocyte % 0.9  Eosinophil % 0.0  Basophil % 0.2  Neutrophil #  13.5  Lymphocyte #  0.9  Monocyte #  0.1  Eosinophil # 0.0  Basophil # 0.0 (Result(s) reported on 22 Dec 2012 at 04:17AM.)   Radiology Results: CT:    19-Oct-14 11:24, CT  Head Without Contrast  CT Head Without Contrast   REASON FOR EXAM:    right sided weakness 1 day ago  COMMENTS:       PROCEDURE: CT  - CT HEAD WITHOUT CONTRAST  - Dec 21 2012 11:24AM     RESULT: Noncontrast CT is compared to a study of 07/21/2012. The   ventricles and sulci are normal. There is no hemorrhage. There is no   focal mass, mass-effect or midline shift. There is no evidence of edema   or territorial infarct. The bone windows demonstrate normal aeration of   the paranasal sinuses and mastoid air cells. There is no skull fracture   demonstrated.    IMPRESSION:    1. No acute intracranial abnormality. Stable appearance.  Dictation Site: 6        Verified By: Sundra Aland, M.D., MD   Radiology Impression: Radiology Impression: MRI personally reviewed by me and normal brain and mild disc buldges on neck   Impression/Recommendations: Recommendations:   labs reviewed by me notes reviewed by me   Probable complex partial seizure-  given the location of symptoms and persistance without MRI evidence.  Pt also has family hx of such Todd's paralysis-  cause of R sided weakness Headache-  most likely post-ictal Cervical disease-  mild and not cause of above EEG VPA 1gm IV x1, magnesium sulfate 1gm IV x 1, toradol 57m IV x 1 start trileptal 1554mBID no driving or operating heavy machinery x 6 months will follow  Electronic Signatures: SmJamison NeighborMD)  (Signed 21-Oct-14 14:56)  Authored: REFERRING PHYSICIAN, Primary Care Physician, Consult, History of Present Illness, Review of Systems, PAST MEDICAL/SURGICAL HISTORY, HOME MEDICATIONS, ALLERGIES, Social/Family History, NURSING VITAL SIGNS, Physical Exam-, LAB RESULTS, RADIOLOGY RESULTS, Recommendations   Last Updated: 21-Oct-14 14:56 by SmJamison NeighborMD)

## 2014-06-25 NOTE — Discharge Summary (Signed)
PATIENT NAME:  Arthur Howe, Arthur Howe MR#:  [id number] DATE OF BIRTH:  Jul 06, 1965  DATE OF ADMISSION:  12/21/2012 DATE OF DISCHARGE:  12/24/2012  DISCHARGE DIAGNOSES:  1. Complex migraine causing right-sided weakness, now improving.  2. Mild cervical disk disease causing neck and occipital head pain, likely due to muscle spasm.  3. Malignant hypertension and tremor likely due to anxiety.   SECONDARY DIAGNOSES:  1. Anxiety.  2. Hypertension.  3. Diverticulitis.  4. Sleep apnea.   CONSULTATIONS:  1. Neurology, Dr. Valora Corporal.  2. Orthopedics, Dr. Earnestine Leys.   PROCEDURES AND RADIOLOGY: MRI of the brain with and without contrast on the 20th of October showed no acute pathology.   MRI of the cervical spine without contrast on the 20th of October showed cervical spondylosis.   Chest x-ray on the 19th of October showed no evidence of pneumonia or CHF. Subsegmental atelectasis.   CT scan of the head without contrast on the 19th of October showed no acute intracranial pathology.   UA on admission was negative on the 19th of October.   Serum vitamin B12 level was within normal limits with a value of 566.   HISTORY AND SHORT HOSPITAL COURSE: The patient is a 49 year old male with above-mentioned medical problems who was admitted for right-sided weakness. The patient's symptoms were concerning for CVA for which he was admitted and underwent MRI of the brain. Please see Dr. Marshia Ly dictated history and physical for further details. Neurological consultation was obtained with Dr. Valora Corporal as the patient was having severe headache along with his weakness. His symptoms were thought to be due to complicated migraine and mild cervical disk disease and was started on Trileptal along with Fioricet as needed. The patient was feeling significantly better. Orthopedic consultation was obtained with Dr. Earnestine Leys for mild cervical spondylosis who did not find any other significant pathology other  than mild cervical disk disease and recommended muscle relaxant and (Dictation Anomaly) MISSING TEXT>> steroids if needed.   The patient was close to his baseline on the 22nd of October and was discharged home in stable condition.   PHYSICAL EXAMINATION:  VITAL SIGNS: On the date of discharge, his vital signs were as follows: Temperature 98, heart rate 85 per minute, respirations 18 per minute, blood pressure 144/93 mmHg. He was saturating 95% on room air.  CARDIOVASCULAR: S1, S2 normal. No murmurs, rubs or gallops.  LUNGS: Clear to auscultation bilaterally. No wheezes, rales, rhonchi or crepitation.  ABDOMEN: Soft, benign.  NEUROLOGIC: Nonfocal examination.   All other physical examination remained at baseline.   DISCHARGE MEDICATIONS:  1. Omeprazole 20 mg p.o. b.i.d.  2. Alprazolam 0.25 mg p.o. b.i.d. as needed.  3. Fluoxetine 20 mg p.o. daily.  4. Multivitamin once daily.  5. Promethazine 25 mg p.o. every 6 hours as needed.  6. Ventolin HFA 2 puffs inhaled 4 times a day as needed.  7. Prednisone 50 mg p.o. daily, taper 10 mg daily until finished.  8. Skelaxin 800 mg p.o. 3 times a day as needed.  9. Fioricet 2 tablets p.o. 1 to 4 times a day as needed for headache.  10. Lisinopril 5 mg p.o. daily.  11. Oxcarbazepine 150 mg p.o. b.i.d. for 1 week, then increase to 300 mg p.o. b.i.d.   DISCHARGE DIET: Low sodium, low fat, low cholesterol.   DISCHARGE ACTIVITY: As tolerated.   DISCHARGE INSTRUCTIONS AND FOLLOWUP: The patient was instructed to follow up with his primary care physician, Dr. Juluis Pitch, in 2  to 3 days. He was also instructed to follow up with [facility] neurology in 1 to 2 weeks. He was set up to get outpatient physical and occupational therapy.   TOTAL TIME DISCHARGING THIS PATIENT: 50 minutes.   ____________________________ Arthur Howe. Arthur Ghazi, MD vss:gb D: 12/28/2012 22:24:27 ET T: 12/29/2012 01:50:01 ET JOB#: [id number]  cc: Manfred Laspina S. Arthur Ghazi, MD,  <Dictator> Youlanda Roys. Lovie Macadamia, MD Mila Homer Tamala Julian, MD Park Breed, MD Arthur Howe [facility] MD ELECTRONICALLY SIGNED 12/30/2012 15:50

## 2014-06-25 NOTE — Consult Note (Signed)
Brief Consult Note: Diagnosis: Mild cervical spondylosis.   Patient was seen by consultant.   Recommend further assessment or treatment.   Comments: 49 year old male admitted 12/21/12 with several complaints related to right arm/leg weakness, confusion, dysarthria right hand, headaches, neck pain, hypertension, anxiety.   No history of  injury.  MRI of Cspine shows mild spondylosis C5-6 and C6-7.  MRI brain normal.  Being treated with IV solumedrol and cervical collar.  Orthopaedic consult ordered. Large crowd in room. Patient examined with wife only present.    Exam: Alert and cooperative with cervical collar in place.  Some pain with range of motion neck in all directions.  Spurling's negative to right.  Shoulder range of motion good with complaint of some pain. Strength intact right arm at shoulder, elbow, hand with give away weakness.  Reflexes 2+ bilaterally.  Sensation intact.  Right leg shows mild weakness of hip flexion. circulation/sensation/motor function good distally.    X-rays:  MRI as above.  Imp:  Diffuse weakness right arm and leg without obvious pathology.           Anxiety  Rx:  Continue steroids and await neurology consult.        Consider mild muscle relaxer and heat to neck        No Orthopaedic conditions identified.         May discharge when comfortable..  Electronic Signatures: Park Breed (MD)  (Signed 21-Oct-14 11:35)  Authored: Brief Consult Note   Last Updated: 21-Oct-14 11:35 by Park Breed (MD)

## 2014-08-17 ENCOUNTER — Encounter: Payer: Self-pay | Admitting: Psychiatry

## 2014-08-17 ENCOUNTER — Ambulatory Visit (INDEPENDENT_AMBULATORY_CARE_PROVIDER_SITE_OTHER): Payer: 59 | Admitting: Psychiatry

## 2014-08-17 VITALS — BP 122/84 | HR 86 | Temp 97.1°F | Ht 72.0 in

## 2014-08-17 DIAGNOSIS — F332 Major depressive disorder, recurrent severe without psychotic features: Secondary | ICD-10-CM | POA: Diagnosis not present

## 2014-08-17 DIAGNOSIS — F449 Dissociative and conversion disorder, unspecified: Secondary | ICD-10-CM | POA: Insufficient documentation

## 2014-08-17 DIAGNOSIS — K589 Irritable bowel syndrome without diarrhea: Secondary | ICD-10-CM | POA: Insufficient documentation

## 2014-08-17 DIAGNOSIS — K5792 Diverticulitis of intestine, part unspecified, without perforation or abscess without bleeding: Secondary | ICD-10-CM | POA: Insufficient documentation

## 2014-08-17 MED ORDER — FLUOXETINE HCL 10 MG PO CAPS: ORAL_CAPSULE | ORAL | Status: DC

## 2014-08-17 MED ORDER — BUPROPION HCL ER (XL) 300 MG PO TB24: 300.0000 mg | ORAL_TABLET | ORAL | Status: DC

## 2014-08-17 NOTE — Addendum Note (Signed)
Addended by: Marjie Skiff on: 08/17/2014 04:24 PM   Modules accepted: Orders

## 2014-08-17 NOTE — Progress Notes (Addendum)
[place] MD/PA/NP OP Progress Note  08/17/2014 3:07 PM Arthur Howe  MRN:  [id number]  Subjective:  A she'll returns her follow-up was major depressive disorder and conversion disorder. He states overall his mood is been pretty good. His main issue right now is low energy and daytime sleepiness. We reviewed that he is using CPAP but he states he was recently assessed for this year ago in his fittings and settings for his CPAP machine were appropriate. That he sleeps about 8 hours a night but then sometimes he'll get up in the morning and then within a couple hours go back to sleep for 2 more hours. He stated that with the increase in the Wellbutrin he has noticed more energy but still has issues above but daytime sleepiness.  He asked Probation officer about discontinuing his topiramate which was started by a neurologist in [place]. He indicated he tried to discontinue it on his own but then he had return of his previously existing migraines. This Probation officer referred him to go back to the neurologist for further advice on this medication.  Patient's Wellbutrin prescription was written for Wellbutrin SR 200 mg in the morning and 100 mg in the afternoon. However patient states he was forgetting to take the afternoon dose and thus was taking all 300 mg in the morning. I informed him that generally they prefer the dosing to be no more than 200 mg at one time for the SR form. I discussed that we would switch him to the XL form in which she could take 300 mg once daily.  He felt that per his wife some of his issues with sleeping during the day may be somewhat related to depression and he indicated he wanted to try another combination of his antidepressants and hopes that his energy level might increase. That he's been on Prozac in the past with some good results. I indicated that this might be a reasonable medication to add to his Wellbutrin.  He indicates he uses Klonopin about once per day and typically takes it at  nighttime.  Guards to his muscle and speech symptoms he states he has had a couple of flareups of the weakness in his leg. He states he's also had a couple of issues with the word finding and speech issues. He occurred since his last visit. Chief Complaint:  Visit Diagnosis:     ICD-9-CM ICD-10-CM   1. Major depressive disorder, recurrent, severe without psychotic features 296.33 F33.2 buPROPion (WELLBUTRIN XL) 300 MG 24 hr tablet     FLUoxetine (PROZAC) 10 MG capsule    Past Medical History:  Past Medical History  Diagnosis Date  . Syncope and collapse   . Depression   . Hypertension   . Hyperlipidemia   . Plantar fascial fibromatosis   . Anxiety   . Sleep apnea   . Migraine   . Diverticulitis   . Conversion disorder     Past Surgical History  Procedure Laterality Date  . Gallbladder surgery    . Urethra surgery    . Colon surgery     Family History:  Family History  Problem Relation Age of Onset  . Arrhythmia Maternal Grandmother     heart murmur   . Hypertension Maternal Grandmother   . Hypertension Mother   . Anxiety disorder Mother   . Heart failure Father   . Bipolar disorder Paternal Uncle   . Schizophrenia Paternal Uncle    Social History:  History   Social History  .  Marital Status: Married    Spouse Name: N/A  . Number of Children: N/A  . Years of Education: N/A   Social History Main Topics  . Smoking status: Never Smoker   . Smokeless tobacco: Never Used  . Alcohol Use: No  . Drug Use: No  . Sexual Activity: Yes   Other Topics Concern  . None   Social History Narrative   Additional History:   Assessment:   Musculoskeletal: Strength & Muscle Tone: within normal limits Gait & Station: normal Patient leans: N/A  Psychiatric Specialty Exam: HPI  Review of Systems  Psychiatric/Behavioral: Positive for depression (Patient reports that his depression has been fairly stable. He does report continued issues with fatigue and low energy.).  Negative for suicidal ideas, hallucinations, memory loss and substance abuse. The patient is not nervous/anxious and does not have insomnia.     Blood pressure 122/84, pulse 86, temperature 97.1 F (36.2 C), temperature source Tympanic, height 6' (1.829 m), SpO2 96 %.There is no weight on file to calculate BMI.  General Appearance: Neat and Well Groomed  Eye Contact:  Good  Speech:  Clear and Coherent and Normal Rate  Volume:  Normal  Mood:  Good  Affect:  Congruent  Thought Process:  Linear and Logical  Orientation:  Full (Time, Place, and Person)  Thought Content:  Negative  Suicidal Thoughts:  No  Homicidal Thoughts:  No  Memory:  Immediate;   Good Recent;   Good Remote;   Fair  Judgement:  Good  Insight:  Good  Psychomotor Activity:  Negative  Concentration:  Good  Recall:  Good  Fund of Knowledge: Good  Language: Good  Akathisia:  Negative  Handed:  Right unknown  AIMS (if indicated):  Not done  Assets:  Communication Skills Desire for Improvement Social Support  ADL's:  Intact  Cognition: WNL  Sleep:  excessive   Is the patient at risk to self?  No. Has the patient been a risk to self in the past 6 months?  No. Has the patient been a risk to self within the distant past?  No. Is the patient a risk to others?  No. Has the patient been a risk to others in the past 6 months?  No. Has the patient been a risk to others within the distant past?  No.  Current Medications: Current Outpatient Prescriptions  Medication Sig Dispense Refill  . albuterol (PROVENTIL HFA;VENTOLIN HFA) 108 (90 BASE) MCG/ACT inhaler Inhale into the lungs every 6 (six) hours as needed for wheezing or shortness of breath.    . ALPRAZolam (XANAX) 0.25 MG tablet Take 0.25 mg by mouth at bedtime as needed for anxiety.    Marland Kitchen buPROPion (WELLBUTRIN SR) 100 MG 12 hr tablet Take 100 mg by mouth 2 (two) times daily.    Marland Kitchen BUTALBITAL-ACETAMINOPHEN PO Take by mouth as needed.    . citalopram (CELEXA) 10 MG  tablet Take 10 mg by mouth daily.    . clonazePAM (KLONOPIN) 0.5 MG tablet Take 0.5 mg by mouth 2 (two) times daily as needed for anxiety.    . dicyclomine (BENTYL) 10 MG capsule Take 10 mg by mouth 4 (four) times daily -  before meals and at bedtime.    . fluticasone-salmeterol (ADVAIR HFA) 115-21 MCG/ACT inhaler Inhale into the lungs.    Marland Kitchen ibuprofen (ADVIL,MOTRIN) 200 MG tablet Take 400 mg by mouth every 6 (six) hours as needed.    Marland Kitchen losartan (COZAAR) 100 MG tablet Take 100 mg by  mouth daily.    Marland Kitchen omeprazole (PRILOSEC) 20 MG capsule Take 20 mg by mouth daily.    Marland Kitchen topiramate (TOPAMAX) 50 MG tablet Take by mouth.    Marland Kitchen buPROPion (WELLBUTRIN XL) 300 MG 24 hr tablet Take 1 tablet (300 mg total) by mouth every morning. 30 tablet 1  . fexofenadine (ALLEGRA) 60 MG tablet Take 60 mg by mouth 2 (two) times daily.    Marland Kitchen FLUoxetine (PROZAC) 10 MG capsule Take 1 capsule in the morning for 7 days and then increase to 2 capsules in the morning. Stop citalopram. 60 capsule 1  . meclizine (ANTIVERT) 25 MG tablet Take 25 mg by mouth 3 (three) times daily.     . mometasone (NASONEX) 50 MCG/ACT nasal spray Place 2 sprays into the nose daily.    . Multiple Vitamin (MULTI VITAMIN DAILY PO) Take by mouth daily.    . nortriptyline (PAMELOR) 10 MG capsule Take 10 mg by mouth at bedtime.    . pantoprazole (PROTONIX) 40 MG tablet     . promethazine (PHENERGAN) 25 MG tablet Take 25 mg by mouth every 6 (six) hours as needed for nausea or vomiting.     No current facility-administered medications for this visit.    Medical Decision Making:  Established Problem, Stable/Improving (1), Review of Medication Regimen & Side Effects (2) and Review of New Medication or Change in Dosage (2) The patient wishes some adjustments to perhaps help him with his energy level and daytime fatigue and sleepiness. Treatment Plan Summary:Medication management Patient will discontinue citalopram. He was started fluoxetine 10 mg a day for 7  days and then go to 20 mg a day. We will change him from his Wellbutrin SR form to Wellbutrin XL 300 mg in the morning. He will continue his clonazepam as needed. He will follow up in 1 month. He's been encouraged call with any questions concerns prior to his next appointment.  He was given a 90 day supply of his clonazepam at the last visit and the should have enough through the end of July. Thus today he will only need the Wellbutrin XL and the Prozac.  Patient contacted the clinic and said he was at the pharmacy and they do not have the orders for his Wellbutrin XL and Prozac. Writer will send these again. 08/17/14 4:07PM  Faith Rogue 08/17/2014, 3:07 PM

## 2014-09-02 ENCOUNTER — Ambulatory Visit: Payer: 59 | Admitting: Licensed Clinical Social Worker

## 2014-09-16 ENCOUNTER — Encounter: Payer: Self-pay | Admitting: Psychiatry

## 2014-09-16 ENCOUNTER — Ambulatory Visit (INDEPENDENT_AMBULATORY_CARE_PROVIDER_SITE_OTHER): Payer: 59 | Admitting: Psychiatry

## 2014-09-16 VITALS — BP 158/98 | HR 96 | Resp 12 | Ht 72.0 in

## 2014-09-16 DIAGNOSIS — F332 Major depressive disorder, recurrent severe without psychotic features: Secondary | ICD-10-CM

## 2014-09-16 MED ORDER — BUPROPION HCL ER (XL) 300 MG PO TB24: 300.0000 mg | ORAL_TABLET | ORAL | Status: DC

## 2014-09-16 MED ORDER — FLUOXETINE HCL 40 MG PO CAPS: 40.0000 mg | ORAL_CAPSULE | Freq: Every day | ORAL | Status: DC
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
## 2014-10-02 ENCOUNTER — Emergency Department: Payer: 59

## 2014-10-02 ENCOUNTER — Emergency Department
Admission: EM | Admit: 2014-10-02 | Discharge: 2014-10-02 | Disposition: A | Discharge status: Home / Self Care | Payer: 59 | Attending: Emergency Medicine | Admitting: Emergency Medicine

## 2014-10-02 ENCOUNTER — Encounter: Payer: Self-pay | Admitting: Emergency Medicine

## 2014-10-02 DIAGNOSIS — Y998 Other external cause status: Secondary | ICD-10-CM | POA: Insufficient documentation

## 2014-10-02 DIAGNOSIS — S40011A Contusion of right shoulder, initial encounter: Secondary | ICD-10-CM | POA: Diagnosis not present

## 2014-10-02 DIAGNOSIS — W01198A Fall on same level from slipping, tripping and stumbling with subsequent striking against other object, initial encounter: Secondary | ICD-10-CM | POA: Diagnosis not present

## 2014-10-02 DIAGNOSIS — Z79899 Other long term (current) drug therapy: Secondary | ICD-10-CM | POA: Insufficient documentation

## 2014-10-02 DIAGNOSIS — I10 Essential (primary) hypertension: Secondary | ICD-10-CM | POA: Diagnosis not present

## 2014-10-02 DIAGNOSIS — S46911A Strain of unspecified muscle, fascia and tendon at shoulder and upper arm level, right arm, initial encounter: Secondary | ICD-10-CM | POA: Diagnosis not present

## 2014-10-02 DIAGNOSIS — Z880 Allergy status to penicillin: Secondary | ICD-10-CM | POA: Diagnosis not present

## 2014-10-02 DIAGNOSIS — Y9289 Other specified places as the place of occurrence of the external cause: Secondary | ICD-10-CM | POA: Insufficient documentation

## 2014-10-02 DIAGNOSIS — S40211A Abrasion of right shoulder, initial encounter: Secondary | ICD-10-CM | POA: Diagnosis not present

## 2014-10-02 DIAGNOSIS — S4991XA Unspecified injury of right shoulder and upper arm, initial encounter: Secondary | ICD-10-CM | POA: Diagnosis present

## 2014-10-02 DIAGNOSIS — Y9389 Activity, other specified: Secondary | ICD-10-CM | POA: Insufficient documentation

## 2014-10-02 MED ORDER — DIAZEPAM 5 MG PO TABS
5.0000 mg | ORAL_TABLET | Freq: Once | ORAL | Status: AC
  Administered 2014-10-02: 5 mg via ORAL
  Filled 2014-10-02: qty 1

## 2014-10-02 MED ORDER — KETOROLAC TROMETHAMINE 60 MG/2ML IM SOLN
60.0000 mg | Freq: Once | INTRAMUSCULAR | Status: AC
  Administered 2014-10-02: 60 mg via INTRAMUSCULAR
  Filled 2014-10-02: qty 2

## 2014-10-02 MED ORDER — CYCLOBENZAPRINE HCL 5 MG PO TABS: 5.0000 mg | ORAL_TABLET | Freq: Three times a day (TID) | ORAL | Status: DC | PRN

## 2014-10-02 MED ORDER — IBUPROFEN 800 MG PO TABS: 800.0000 mg | ORAL_TABLET | Freq: Three times a day (TID) | ORAL | Status: DC | PRN

## 2014-10-02 NOTE — ED Notes (Signed)
Pt states he lost his balance, fell back into shower, hit right shoulder on soap dish. Denies loc or hitting his head.

## 2014-10-02 NOTE — ED Provider Notes (Signed)
[facility] Emergency Department Provider Note  ____________________________________________  Time seen: Approximately 7:52 PM  I have reviewed the triage vital signs and the nursing notes.   HISTORY  Chief Complaint Shoulder Pain  HPI Arthur Howe is a 49 y.o. male who presents for evaluation of right shoulder pain. States he fell in a tub prior to arrival and landed on his scapula. Complains of increased pain and denies any shortness of breath.   Past Medical History  Diagnosis Date  . Syncope and collapse   . Depression   . Hypertension   . Hyperlipidemia   . Plantar fascial fibromatosis   . Anxiety   . Sleep apnea   . Migraine   . Diverticulitis   . Conversion disorder     Patient Active Problem List   Diagnosis Date Noted  . 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 Diverticulitis 08/17/2014  . 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 Depression, major, severe recurrence 08/17/2014  . Acid reflux 10/15/2013  . Headache, migraine 10/15/2013  . Faintness 10/05/2013  . Conversion disorder with abnormal movement 10/05/2013  . Essential hypertension 10/05/2013  . Syncope 10/05/2013  . Depression 10/05/2013  . Headache 10/05/2013  . Anxiety 09/14/2013  . Apnea, sleep 09/14/2013  . LBP (low back pain) 06/12/2013  . Dupuytren's contracture of foot 06/12/2013  . HLD (hyperlipidemia) 06/12/2013  . ESSENTIAL HYPERTENSION, BENIGN 04/16/2007  . COUGH 04/16/2007    Past Surgical History  Procedure Laterality Date  . Gallbladder surgery    . Urethra surgery    . Colon surgery      Current Outpatient Rx  Name  Route  Sig  Dispense  Refill  . albuterol (PROVENTIL HFA;VENTOLIN HFA) 108 (90 BASE) MCG/ACT inhaler   Inhalation   Inhale into the lungs every 6 (six) hours as needed for wheezing or shortness of breath.         . ALPRAZolam (XANAX) 0.25 MG tablet   Oral   Take 0.25 mg by mouth at bedtime as needed for anxiety.         Marland Kitchen buPROPion  (WELLBUTRIN SR) 100 MG 12 hr tablet   Oral   Take 100 mg by mouth 2 (two) times daily.         Marland Kitchen buPROPion (WELLBUTRIN XL) 300 MG 24 hr tablet   Oral   Take 1 tablet (300 mg total) by mouth every morning.   30 tablet   2   . BUTALBITAL-ACETAMINOPHEN PO   Oral   Take by mouth as needed.         . citalopram (CELEXA) 10 MG tablet   Oral   Take 10 mg by mouth daily.         . clonazePAM (KLONOPIN) 0.5 MG tablet   Oral   Take 0.5 mg by mouth 2 (two) times daily as needed for anxiety.         . cyclobenzaprine (FLEXERIL) 5 MG tablet   Oral   Take 1 tablet (5 mg total) by mouth every 8 (eight) hours as needed for muscle spasms.   30 tablet   0   . dicyclomine (BENTYL) 10 MG capsule   Oral   Take 10 mg by mouth 4 (four) times daily -  before meals and at bedtime.         . fexofenadine (ALLEGRA) 60 MG tablet   Oral   Take 60 mg by mouth 2 (two) times daily.         Marland Kitchen  FLUoxetine (PROZAC) 40 MG capsule   Oral   Take 1 capsule (40 mg total) by mouth daily.   30 capsule   2   . fluticasone-salmeterol (ADVAIR HFA) 115-21 MCG/ACT inhaler   Inhalation   Inhale into the lungs.         Marland Kitchen ibuprofen (ADVIL,MOTRIN) 800 MG tablet   Oral   Take 1 tablet (800 mg total) by mouth every 8 (eight) hours as needed.   30 tablet   0   . losartan (COZAAR) 100 MG tablet   Oral   Take 100 mg by mouth daily.         . meclizine (ANTIVERT) 25 MG tablet   Oral   Take 25 mg by mouth 3 (three) times daily.          . mometasone (NASONEX) 50 MCG/ACT nasal spray   Nasal   Place 2 sprays into the nose daily.         . Multiple Vitamin (MULTI VITAMIN DAILY PO)   Oral   Take by mouth daily.         . nortriptyline (PAMELOR) 10 MG capsule   Oral   Take 10 mg by mouth at bedtime.         Marland Kitchen omeprazole (PRILOSEC) 20 MG capsule   Oral   Take 20 mg by mouth daily.         . pantoprazole (PROTONIX) 40 MG tablet               . promethazine (PHENERGAN) 25 MG  tablet   Oral   Take 25 mg by mouth every 6 (six) hours as needed for nausea or vomiting.         . topiramate (TOPAMAX) 50 MG tablet   Oral   Take by mouth.           Allergies Amoxicillin and Penicillins  Family History  Problem Relation Age of Onset  . Arrhythmia Maternal Grandmother     heart murmur   . Hypertension Maternal Grandmother   . Hypertension Mother   . Anxiety disorder Mother   . Heart failure Father   . Bipolar disorder Paternal Uncle   . Schizophrenia Paternal Uncle     Social History History  Substance Use Topics  . Smoking status: Never Smoker   . Smokeless tobacco: Never Used  . Alcohol Use: No    Review of Systems Constitutional: No fever/chills Eyes: No visual changes. ENT: No sore throat. Cardiovascular: Denies chest pain. Respiratory: Denies shortness of breath. Gastrointestinal: No abdominal pain.  No nausea, no vomiting.  No diarrhea.  No constipation. Genitourinary: Negative for dysuria. Musculoskeletal: Positive for right scapular pain. Skin: Negative for rash. Neurological: Negative for headaches, focal weakness or numbness.  10-point ROS otherwise negative.  ____________________________________________   PHYSICAL EXAM:  VITAL SIGNS: ED Triage Vitals  Enc Vitals Group     BP 10/02/14 1905 153/91 mmHg     Pulse Rate 10/02/14 1905 89     Resp 10/02/14 1905 18     Temp 10/02/14 1905 98 F (36.7 C)     Temp Source 10/02/14 1905 Oral     SpO2 10/02/14 1905 98 %     Weight 10/02/14 1903 246 lb (111.585 kg)     Height 10/02/14 1903 6' (1.829 m)     Head Cir --      Peak Flow --      Pain Score 10/02/14 1904 8     Pain  Loc --      Pain Edu? --      Excl. in [employer]? --     Constitutional: Alert and oriented. Well appearing and in no acute distress. Neck: No stridor.  No spinal tenderness. Cardiovascular: Normal rate, regular rhythm. Grossly normal heart sounds.  Good peripheral circulation. Respiratory: Normal respiratory  effort.  No retractions. Lungs CTAB. Musculoskeletal: No evidence of effusion. Right scapular abrasion. With limited range of motion of right arm. Increased pain with extension and abduction. Neurologic:  Normal speech and language. No gross focal neurologic deficits are appreciated. No gait instability. Skin:  Skin is warm, dry and intact. No rash noted. Psychiatric: Mood and affect are normal. Speech and behavior are normal.  ____________________________________________   LABS (all labs ordered are listed, but only abnormal results are displayed)  Labs Reviewed - No data to display ____________________________________________ RADIOLOGY  Right scapula negative for fracture. Interpreted by radiologist few by myself. ____________________________________________   PROCEDURES  Procedure(s) performed: None  Critical Care performed: No  ____________________________________________   INITIAL IMPRESSION / ASSESSMENT AND PLAN / ED COURSE  Pertinent labs & imaging results that were available during my care of the patient were reviewed by me and considered in my medical decision making (see chart for details).  Status post fall with right scapular contusion. Rx given for Motrin 800 mg 3 times a day. I'll 60 mg given IM while here in the ED. He voices no other emergency medical complaints and will return to the ER with any worsening of symptoms. ____________________________________________   FINAL CLINICAL IMPRESSION(S) / ED DIAGNOSES  Final diagnoses:  Shoulder strain, right, initial encounter  Shoulder contusion, right, initial encounter      Arlyss Repress, PA-C 10/02/14 2200  Ahmed Prima, MD 10/02/14 [phone]

## 2014-10-02 NOTE — Discharge Instructions (Signed)
Contusion °A contusion is a deep bruise. Contusions happen when an injury causes bleeding under the skin. Signs of bruising include pain, puffiness (swelling), and discolored skin. The contusion may turn blue, purple, or yellow. °HOME CARE  °· Put ice on the injured area. °¨ Put ice in a plastic bag. °¨ Place a towel between your skin and the bag. °¨ Leave the ice on for 15-20 minutes, 03-04 times a day. °· Only take medicine as told by your doctor. °· Rest the injured area. °· If possible, raise (elevate) the injured area to lessen puffiness. °GET HELP RIGHT AWAY IF:  °· You have more bruising or puffiness. °· You have pain that is getting worse. °· Your puffiness or pain is not helped by medicine. °MAKE SURE YOU:  °· Understand these instructions. °· Will watch your condition. °· Will get help right away if you are not doing well or get worse. °Document Released: 08/08/2007 Document Revised: 05/14/2011 Document Reviewed: 12/25/2010 °[employer]® Patient Information ©2015 [employer]. This information is not intended to replace advice given to you by your health care provider. Make sure you discuss any questions you have with your health care provider. ° °

## 2014-10-13 ENCOUNTER — Ambulatory Visit: Payer: Self-pay | Admitting: Psychiatry

## 2014-10-26 ENCOUNTER — Encounter: Payer: Self-pay | Admitting: Psychiatry

## 2014-10-26 ENCOUNTER — Ambulatory Visit (INDEPENDENT_AMBULATORY_CARE_PROVIDER_SITE_OTHER): Payer: 59 | Admitting: Psychiatry

## 2014-10-26 ENCOUNTER — Ambulatory Visit: Payer: Self-pay | Admitting: Licensed Clinical Social Worker

## 2014-10-26 VITALS — BP 140/82 | HR 98 | Temp 97.2°F | Ht 71.0 in | Wt 236.8 lb

## 2014-10-26 DIAGNOSIS — F332 Major depressive disorder, recurrent severe without psychotic features: Secondary | ICD-10-CM

## 2014-10-26 MED ORDER — FLUOXETINE HCL 40 MG PO CAPS: 40.0000 mg | ORAL_CAPSULE | Freq: Every day | ORAL | Status: DC

## 2014-10-26 MED ORDER — BUPROPION HCL ER (XL) 300 MG PO TB24
300.0000 mg | ORAL_TABLET | ORAL | Status: DC
Start: 2014-10-26 — End: 2014-12-14

## 2014-10-26 MED ORDER — CLONAZEPAM 0.5 MG PO TABS: 0.5000 mg | ORAL_TABLET | Freq: Two times a day (BID) | ORAL | Status: DC | PRN

## 2014-10-26 NOTE — Progress Notes (Signed)
[facility] MD/PA/NP OP Progress Note  10/26/2014 3:58 PM Arthur Howe  MRN:  [id number]  Subjective: Patient returns for follow-up was major depressive disorder. He states things are going well. 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 He states he is walking a lot in that job and states that his physical symptoms of somewhat improved. He is having less slurred speech and he is walking at this visit without a cane which is different than his last visit. He feels his mood is overall good. He states that he has not noticed a dramatic change in his mood and that his wife also verbalizes no dramatic change. However he does feel good every day and enjoy his volunteer work. He states he's applied for a job back at his prior employer however the patient was going to be lower than he has had in the past. He states he also has been trying the interview and go to other jobs but when he describes his periodic issues with the weakness and slurred speech that employers are not interested in hiring him. Chief Complaint: good Chief Complaint    Follow-up; Medication Refill     argument Visit Diagnosis:     ICD-9-CM ICD-10-CM   1. Major depressive disorder, recurrent, severe without psychotic features 296.33 F33.2 FLUoxetine (PROZAC) 40 MG capsule     buPROPion (WELLBUTRIN XL) 300 MG 24 hr tablet    Past Medical History:  Past Medical History  Diagnosis Date  . Syncope and collapse   . Depression   . Hypertension   . Hyperlipidemia   . Plantar fascial fibromatosis   . Anxiety   . Sleep apnea   . Migraine   . Diverticulitis   . Conversion disorder     Past Surgical History  Procedure Laterality Date  . Gallbladder surgery    . Urethra surgery    . Colon surgery     Family History:  Family History  Problem Relation Age of Onset  . Arrhythmia Maternal Grandmother     heart murmur   . Hypertension Maternal Grandmother   . Hypertension Mother   . Anxiety disorder Mother    . Heart failure Father   . Bipolar disorder Paternal Uncle   . Schizophrenia Paternal Uncle    Social History:  Social History   Social History  . Marital Status: Married    Spouse Name: N/A  . Number of Children: N/A  . Years of Education: N/A   Social History Main Topics  . Smoking status: Never Smoker   . Smokeless tobacco: Never Used  . Alcohol Use: No  . Drug Use: No  . Sexual Activity: Yes   Other Topics Concern  . None   Social History Narrative   Additional History:   Assessment:   Musculoskeletal: Strength & Muscle Tone: within normal limits Gait & Station: normal Patient leans: N/A  Psychiatric Specialty Exam: HPI  Review of Systems  Psychiatric/Behavioral: Negative for depression, suicidal ideas, hallucinations, memory loss and substance abuse. The patient is not nervous/anxious and does not have insomnia.     Blood pressure 140/82, pulse 98, temperature 97.2 F (36.2 C), temperature source Tympanic, height 5\' 11"  (1.803 m), weight 236 lb 12.8 oz (107.412 kg), SpO2 95 %.Body mass index is 33.04 kg/(m^2).  General Appearance: Neat and Well Groomed  Eye Contact:  Good  Speech:  Patient had slow rate, some dysarthria and stuttering periodically.. To be worse when discussing frustrating topics.  Volume:  Normal  Mood:  Good  Affect:  Bright  Thought Process:  Linear and Logical  Orientation:  Full (Time, Place, and Person)  Thought Content:  Negative  Suicidal Thoughts:  No  Homicidal Thoughts:  No  Memory:  Immediate;   Good Recent;   Good Remote;   Fair  Judgement:  Good  Insight:  Good  Psychomotor Activity:  Negative  Concentration:  Good  Recall:  Good  Fund of Knowledge: Good  Language: Good  Akathisia:  Negative  Handed:  Right unknown  AIMS (if indicated):  Not done  Assets:  Communication Skills Desire for Improvement Social Support  ADL's:  Intact  Cognition: WNL  Sleep:  excessive   Is the patient at risk to self?  No. Has  the patient been a risk to self in the past 6 months?  No. Has the patient been a risk to self within the distant past?  No. Is the patient a risk to others?  No. Has the patient been a risk to others in the past 6 months?  No. Has the patient been a risk to others within the distant past?  No.  Current Medications: Current Outpatient Prescriptions  Medication Sig Dispense Refill  . albuterol (PROVENTIL HFA;VENTOLIN HFA) 108 (90 BASE) MCG/ACT inhaler Inhale into the lungs every 6 (six) hours as needed for wheezing or shortness of breath.    . ALPRAZolam (XANAX) 0.25 MG tablet Take 0.25 mg by mouth at bedtime as needed for anxiety.    . benzonatate (TESSALON) 100 MG capsule TK 1 C PO TID PRN COU  0  . buPROPion (WELLBUTRIN SR) 100 MG 12 hr tablet Take 100 mg by mouth 2 (two) times daily.    Marland Kitchen buPROPion (WELLBUTRIN XL) 300 MG 24 hr tablet Take 1 tablet (300 mg total) by mouth every morning. 90 tablet 0  . BUTALBITAL-ACETAMINOPHEN PO Take by mouth as needed.    . cefdinir (OMNICEF) 300 MG capsule TK 1 C PO BID FOR 10 DAYS  0  . citalopram (CELEXA) 10 MG tablet Take 10 mg by mouth daily.    . clonazePAM (KLONOPIN) 0.5 MG tablet Take 1 tablet (0.5 mg total) by mouth 2 (two) times daily as needed for anxiety. 60 tablet 2  . dicyclomine (BENTYL) 10 MG capsule Take 10 mg by mouth 4 (four) times daily -  before meals and at bedtime.    . fexofenadine (ALLEGRA) 60 MG tablet Take 60 mg by mouth 2 (two) times daily.    Marland Kitchen FLUoxetine (PROZAC) 10 MG capsule TK 1 C PO QAM FOR 7 DAYS THEN INCREASE TO 2 CS QAM  1  . FLUoxetine (PROZAC) 40 MG capsule Take 1 capsule (40 mg total) by mouth daily. 90 capsule 0  . fluticasone (FLONASE) 50 MCG/ACT nasal spray SHAKE LQ AND U 2 SPRAYS IEN QD  0  . fluticasone-salmeterol (ADVAIR HFA) 115-21 MCG/ACT inhaler Inhale into the lungs.    . lidocaine (XYLOCAINE) 2 % solution SWISH AND SPIT 5 ML PO Q 4 H PRN  0  . loratadine (CLARITIN) 10 MG tablet TK 1 T PO QD FOR ALLERGY  RELIEF  0  . losartan (COZAAR) 100 MG tablet Take 100 mg by mouth daily.    . mometasone (NASONEX) 50 MCG/ACT nasal spray Place 2 sprays into the nose daily.    . Multiple Vitamin (MULTI VITAMIN DAILY PO) Take by mouth daily.    . pantoprazole (PROTONIX) 40 MG tablet     .  topiramate (TOPAMAX) 50 MG tablet Take by mouth.     No current facility-administered medications for this visit.    Medical Decision Making:  Established Problem, Stable/Improving (1), Review of Medication Regimen & Side Effects (2) and Review of New Medication or Change in Dosage (2) The patient wishes some adjustments to perhaps help him with his energy level and daytime fatigue and sleepiness. Treatment Plan Summary:Medication management He is doing well in regards to his mood and anxiety on the current regimen. Patient will discontinue citalopram. Continue  Wellbutrin XL 300 mg in the morning,  Prozac 40 mg, and his clonazepam 0.5 mg twice a day as needed for anxiety. The patient will follow up in 2 months. Faith Rogue 10/26/2014, 3:58 PM

## 2014-12-14 ENCOUNTER — Other Ambulatory Visit: Payer: Self-pay | Admitting: Psychiatry

## 2014-12-23 ENCOUNTER — Encounter: Payer: Self-pay | Admitting: Psychiatry

## 2014-12-23 ENCOUNTER — Ambulatory Visit (INDEPENDENT_AMBULATORY_CARE_PROVIDER_SITE_OTHER): Payer: 59 | Admitting: Psychiatry

## 2014-12-23 VITALS — BP 122/86 | HR 86 | Temp 98.2°F | Ht 71.5 in | Wt 241.6 lb

## 2014-12-23 DIAGNOSIS — F411 Generalized anxiety disorder: Secondary | ICD-10-CM

## 2014-12-23 DIAGNOSIS — F332 Major depressive disorder, recurrent severe without psychotic features: Secondary | ICD-10-CM

## 2014-12-23 MED ORDER — FLUOXETINE HCL 20 MG PO CAPS: 20.0000 mg | ORAL_CAPSULE | Freq: Every day | ORAL | Status: DC

## 2014-12-23 MED ORDER — CLONAZEPAM 1 MG PO TABS: 1.0000 mg | ORAL_TABLET | Freq: Two times a day (BID) | ORAL | Status: DC | PRN

## 2014-12-23 NOTE — Progress Notes (Signed)
BH MD/PA/NP OP Progress Note  12/23/2014 9:19 AM Arthur Howe  MRN:  [id number]  Subjective: Patient returns for follow-up was major depressive disorder. He indicates that he has been having breakdowns weekly. He indicated they typically surround when he is dealing with the household finances. He is currently not working due to his anxiety, symptoms of dysarthria and impacts on his ability to walk secondary to his anxiety. He states that when he is paying the bills and there are issues with not having enough money he becomes significant stress and has a breakdown. He states during these breakdowns he cries. He states there is no issue with becoming suicidal but that he starts thinking about he may not be carrying on his role as the "man" in the house because he is not working and contributing financially. He states that during those episodes he would take his clonazepam and it was effective in helping him. He stated that and he did have to take 4 of his 0.5 mg tablets over the course of the day. He states typically he only uses it once a day at bedtime  He presents with a form for renewal of his handicap parking. He also states that one positive thing is that his disability application has not been denied as it was previously. Chief Complaint: not good Chief Complaint    Follow-up; Medication Refill; Anxiety; Panic Attack     argument Visit Diagnosis:   No diagnosis found.  Past Medical History:  Past Medical History  Diagnosis Date  . Syncope and collapse   . Depression   . Hypertension   . Hyperlipidemia   . Plantar fascial fibromatosis   . Anxiety   . Sleep apnea   . Migraine   . Diverticulitis   . Conversion disorder     Past Surgical History  Procedure Laterality Date  . Gallbladder surgery    . Urethra surgery    . Colon surgery     Family History:  Family History  Problem Relation Age of Onset  . Arrhythmia Maternal Grandmother     heart murmur   . Hypertension  Maternal Grandmother   . Hypertension Mother   . Anxiety disorder Mother   . Heart failure Father   . Bipolar disorder Paternal Uncle   . Schizophrenia Paternal Uncle    Social History:  Social History   Social History  . Marital Status: Married    Spouse Name: N/A  . Number of Children: N/A  . Years of Education: N/A   Social History Main Topics  . Smoking status: Never Smoker   . Smokeless tobacco: Never Used  . Alcohol Use: No  . Drug Use: No  . Sexual Activity: Yes   Other Topics Concern  . None   Social History Narrative   Additional History:   Assessment:   Musculoskeletal: Strength & Muscle Tone: within normal limits Gait & Station: Slow and ambulating with a cane Patient leans: N/A  Psychiatric Specialty Exam: Anxiety Symptoms include nervous/anxious behavior. Patient reports no insomnia or suicidal ideas.      Review of Systems  Psychiatric/Behavioral: Negative for depression, suicidal ideas, hallucinations, memory loss and substance abuse. The patient is nervous/anxious. The patient does not have insomnia.   All other systems reviewed and are negative.   Blood pressure 122/86, pulse 86, temperature 98.2 F (36.8 C), temperature source Tympanic, height 5' 11.5" (1.816 m), weight 241 lb 9.6 oz (109.589 kg), SpO2 96 %.Body mass index is 33.23 kg/(m^2).  General Appearance: Neat and Well Groomed  Eye Contact:  Good  Speech:  Patient had slow rate, some dysarthria and stuttering periodically.. To be worse when discussing frustrating topics.  Volume:  Normal  Mood:  Good  Affect:  Bright  Thought Process:  Linear and Logical  Orientation:  Full (Time, Place, and Person)  Thought Content:  Negative  Suicidal Thoughts:  No  Homicidal Thoughts:  No  Memory:  Immediate;   Good Recent;   Good Remote;   Fair  Judgement:  Good  Insight:  Good  Psychomotor Activity:  Negative  Concentration:  Good  Recall:  Good  Fund of Knowledge: Good  Language: Good   Akathisia:  Negative  Handed:  Right unknown  AIMS (if indicated):  Not done  Assets:  Communication Skills Desire for Improvement Social Support  ADL's:  Intact  Cognition: WNL  Sleep:  excessive   Is the patient at risk to self?  No. Has the patient been a risk to self in the past 6 months?  No. Has the patient been a risk to self within the distant past?  No. Is the patient a risk to others?  No. Has the patient been a risk to others in the past 6 months?  No. Has the patient been a risk to others within the distant past?  No.  Current Medications: Current Outpatient Prescriptions  Medication Sig Dispense Refill  . albuterol (PROVENTIL HFA;VENTOLIN HFA) 108 (90 BASE) MCG/ACT inhaler Inhale into the lungs every 6 (six) hours as needed for wheezing or shortness of breath.    . ALPRAZolam (XANAX) 0.25 MG tablet Take 0.25 mg by mouth at bedtime as needed for anxiety.    . benzonatate (TESSALON) 100 MG capsule TK 1 C PO TID PRN COU  0  . buPROPion (WELLBUTRIN SR) 100 MG 12 hr tablet Take 100 mg by mouth 2 (two) times daily.    Marland Kitchen buPROPion (WELLBUTRIN XL) 300 MG 24 hr tablet Take 1 tablet by mouth  every morning 90 tablet 0  . BUTALBITAL-ACETAMINOPHEN PO Take by mouth as needed.    . cefdinir (OMNICEF) 300 MG capsule TK 1 C PO BID FOR 10 DAYS  0  . citalopram (CELEXA) 10 MG tablet Take 10 mg by mouth daily.    . clonazePAM (KLONOPIN) 1 MG tablet Take 1 tablet (1 mg total) by mouth 2 (two) times daily as needed for anxiety. 1 tablet 0  . dicyclomine (BENTYL) 10 MG capsule Take 10 mg by mouth 4 (four) times daily -  before meals and at bedtime.    . fexofenadine (ALLEGRA) 60 MG tablet Take 60 mg by mouth 2 (two) times daily.    Marland Kitchen FLUoxetine (PROZAC) 10 MG capsule TK 1 C PO QAM FOR 7 DAYS THEN INCREASE TO 2 CS QAM  1  . FLUoxetine (PROZAC) 20 MG capsule Take 1 capsule (20 mg total) by mouth daily. Take with 40 mg capsules to equal 60 mg daily. 30 capsule 1  . FLUoxetine (PROZAC) 40 MG  capsule Take 1 capsule by mouth  daily 90 capsule 0  . fluticasone (FLONASE) 50 MCG/ACT nasal spray SHAKE LQ AND U 2 SPRAYS IEN QD  0  . fluticasone-salmeterol (ADVAIR HFA) 115-21 MCG/ACT inhaler Inhale into the lungs.    . lidocaine (XYLOCAINE) 2 % solution SWISH AND SPIT 5 ML PO Q 4 H PRN  0  . loratadine (CLARITIN) 10 MG tablet TK 1 T PO QD FOR ALLERGY RELIEF  0  .  losartan (COZAAR) 100 MG tablet Take 100 mg by mouth daily.    . mometasone (NASONEX) 50 MCG/ACT nasal spray Place 2 sprays into the nose daily.    . Multiple Vitamin (MULTI VITAMIN DAILY PO) Take by mouth daily.    . pantoprazole (PROTONIX) 40 MG tablet     . topiramate (TOPAMAX) 50 MG tablet Take by mouth.     No current facility-administered medications for this visit.    Medical Decision Making:  Established Problem, Stable/Improving (1), New Problem, with no additional work-up planned (3), Review of Medication Regimen & Side Effects (2) and Review of New Medication or Change in Dosage (2) The patient wishes some adjustments to perhaps help him with his energy level and daytime fatigue and sleepiness. Treatment Plan Summary:Medication management   Major depressive disorder, recurrent, moderate Continue  Wellbutrin XL 300 mg in the morning. We will increase his from Prozac 40 mg 60 mg to target any anxiety.  Other Specified Anxiety Disorder -increase his clonazepam from 0.5 mg twice a day to 1 mg twice a day as needed for anxiety appears to be related to finances and physical limitations.. The patient will follow up in 1 months.   I have ordered 20 mg Prozac capsules so that he can continue to use his Prozac 40 mg capsules.  I have encouraged him to engage in therapy to deal with coping strategies given his financial situation he be a long-standing situation in his life.  Of note patient did not receive a prescription for his clonazepam as he will use his 0.5 mg tablets. He may run out short of these given that the will  need to take 4 daily to equal his new dose of 1 mg twice daily.   Faith Rogue 12/23/2014, 9:19 AM

## 2015-01-03 ENCOUNTER — Ambulatory Visit: Payer: Self-pay | Admitting: Licensed Clinical Social Worker

## 2015-01-14 ENCOUNTER — Ambulatory Visit (INDEPENDENT_AMBULATORY_CARE_PROVIDER_SITE_OTHER): Payer: 59 | Admitting: Licensed Clinical Social Worker

## 2015-01-14 DIAGNOSIS — F332 Major depressive disorder, recurrent severe without psychotic features: Secondary | ICD-10-CM

## 2015-01-17 ENCOUNTER — Telehealth: Payer: Self-pay | Admitting: Psychiatry

## 2015-01-17 NOTE — Telephone Encounter (Signed)
Patient contacted the clinic stating his primary care wanted to prescribe something for headaches and also asked whether this writer wanted to prescribe the medication for headaches. I informed the patient that his primary care should prescribe medication for his headaches. I did make them aware that Imitrex and tramadol can interact with his Prozac. AW

## 2015-01-18 ENCOUNTER — Ambulatory Visit: Payer: Self-pay | Admitting: Psychiatry

## 2015-01-24 NOTE — Progress Notes (Signed)
   THERAPIST PROGRESS NOTE  Session Time: 26min  Participation Level: Active  Behavioral Response: CasualAlertDepressed  Type of Therapy: Individual Therapy  Treatment Goals addressed: Coping  Interventions: CBT, Motivational Interviewing, Strength-based, Supportive, Family Systems and Reframing  Summary: Arthur Howe is a 49 y.o. male who presents with symptoms of his diagnosis.  Discussion on the transition of therapist and listed stressors such as: pain, finances.   Patient is currently unemployed and wants to begin working, but unable due to disability. Reports taking medication as prescribed and having a strained relationship with his wife and children. Role play coping skills and gave homework of journaling.  Suicidal/Homicidal: Nowithout intent/plan  Therapist Response: LCSW provided Patient with ongoing emotional support and encouragement.  Normalized his feelings.  Commended Patient on his progress and reinforced the importance of client staying focused on his own strengths and resources and resiliency. Processed various strategies for dealing with stressors.   Plan: Return again in 2 weeks.  Diagnosis: Axis I: Major Depression, Recurrent severe    Axis II: No diagnosis    Lubertha South 01/24/2015

## 2015-01-25 ENCOUNTER — Ambulatory Visit: Payer: Self-pay | Admitting: Psychiatry

## 2015-02-01 ENCOUNTER — Encounter: Payer: Self-pay | Admitting: Psychiatry

## 2015-02-01 ENCOUNTER — Ambulatory Visit (INDEPENDENT_AMBULATORY_CARE_PROVIDER_SITE_OTHER): Payer: 59 | Admitting: Licensed Clinical Social Worker

## 2015-02-01 ENCOUNTER — Ambulatory Visit (INDEPENDENT_AMBULATORY_CARE_PROVIDER_SITE_OTHER): Payer: 59 | Admitting: Psychiatry

## 2015-02-01 VITALS — BP 124/68 | HR 104 | Temp 98.1°F | Ht 71.5 in | Wt 240.8 lb

## 2015-02-01 DIAGNOSIS — F411 Generalized anxiety disorder: Secondary | ICD-10-CM | POA: Diagnosis not present

## 2015-02-01 DIAGNOSIS — F332 Major depressive disorder, recurrent severe without psychotic features: Secondary | ICD-10-CM

## 2015-02-01 NOTE — Progress Notes (Signed)
BH MD/PA/NP OP Progress Note  02/01/2015 10:04 AM Arthur Howe  MRN:  [id number]  Subjective: Patient returns for follow-up was major depressive disorder. He states overall things are going well. He states that he has not had the breakdowns that he was having on a regular basis prior to his last visit. 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  He states that his slurred speech has not been occurring as often. He did state that he had some anxiety over Thanksgiving because he went to his father-in-law's house. He states ever months ago he and his father-in-law had a big argument and thus their relationship is been straining. He states on that day he took the Klonopin 3 times and it helped keep him calm during that interaction. He states he largely uses the Klonopin at bedtime and has increased his dose as we last discussed to 1 mg twice a day.   He is still waiting a response in regards to his disability application. He is somewhat optimistic because he is heard from others when  you have not received an immediate denial that can be a good sign.  Chief Complaint: good Chief Complaint    Follow-up; Medication Refill     argument Visit Diagnosis:     ICD-9-CM ICD-10-CM   1. Major depressive disorder, recurrent, severe without psychotic features ([place]) 296.33 F33.2   2. Anxiety state 300.00 F41.1     Past Medical History:  Past Medical History  Diagnosis Date  . Syncope and collapse   . Depression   . Hypertension   . Hyperlipidemia   . Plantar fascial fibromatosis   . Anxiety   . Sleep apnea   . Migraine   . Diverticulitis   . Conversion disorder     Past Surgical History  Procedure Laterality Date  . Gallbladder surgery    . Urethra surgery    . Colon surgery     Family History:  Family History  Problem Relation Age of Onset  . Arrhythmia Maternal  Grandmother     heart murmur   . Hypertension Maternal Grandmother   . Hypertension Mother   . Anxiety disorder Mother   . Heart failure Father   . Bipolar disorder Paternal Uncle   . Schizophrenia Paternal Uncle    Social History:  Social History   Social History  . Marital Status: Married    Spouse Name: N/A  . Number of Children: N/A  . Years of Education: N/A   Social History Main Topics  . Smoking status: Never Smoker   . Smokeless tobacco: Never Used  . Alcohol Use: No  . Drug Use: No  . Sexual Activity: Yes   Other Topics Concern  . None   Social History Narrative   Additional History:   Assessment:   Musculoskeletal: Strength & Muscle Tone: within normal limits Gait & Station: Slow and ambulating with a cane Patient leans: N/A  Psychiatric Specialty Exam: Anxiety Symptoms include nervous/anxious behavior. Patient reports no insomnia or suicidal ideas.      Review of Systems  Psychiatric/Behavioral: Negative for depression, suicidal ideas, hallucinations, memory loss and substance abuse. The patient is nervous/anxious. The patient does not have insomnia.   All other systems reviewed and are negative.   Blood pressure 124/68, pulse 104, temperature 98.1 F (36.7 C), temperature source Tympanic, height 5'  11.5" (1.816 m), weight 240 lb 12.8 oz (109.226 kg), SpO2 96 %.Body mass index is 33.12 kg/(m^2).  General Appearance: Neat and Well Groomed  Eye Contact:  Good  Speech:  Patient had slow rate, some dysarthria and stuttering periodically.. To be worse when discussing frustrating topics.  Volume:  Normal  Mood:  Good  Affect:  Bright  Thought Process:  Linear and Logical  Orientation:  Full (Time, Place, and Person)  Thought Content:  Negative  Suicidal Thoughts:  No  Homicidal Thoughts:  No  Memory:  Immediate;   Good Recent;   Good Remote;   Fair  Judgement:  Good  Insight:  Good  Psychomotor Activity:  Negative  Concentration:  Good   Recall:  Good  Fund of Knowledge: Good  Language: Good  Akathisia:  Negative  Handed:  Right unknown  AIMS (if indicated):  Not done  Assets:  Communication Skills Desire for Improvement Social Support  ADL's:  Intact  Cognition: WNL  Sleep:  excessive   Is the patient at risk to self?  No. Has the patient been a risk to self in the past 6 months?  No. Has the patient been a risk to self within the distant past?  No. Is the patient a risk to others?  No. Has the patient been a risk to others in the past 6 months?  No. Has the patient been a risk to others within the distant past?  No.  Current Medications: Current Outpatient Prescriptions  Medication Sig Dispense Refill  . albuterol (PROVENTIL HFA;VENTOLIN HFA) 108 (90 BASE) MCG/ACT inhaler Inhale into the lungs every 6 (six) hours as needed for wheezing or shortness of breath.    Marland Kitchen buPROPion (WELLBUTRIN XL) 300 MG 24 hr tablet Take 1 tablet by mouth  every morning 90 tablet 0  . BUTALBITAL-ACETAMINOPHEN PO Take by mouth as needed.    . cefdinir (OMNICEF) 300 MG capsule TK 1 C PO BID FOR 10 DAYS  0  . clonazePAM (KLONOPIN) 1 MG tablet Take 1 tablet (1 mg total) by mouth 2 (two) times daily as needed for anxiety. 1 tablet 0  . cyclobenzaprine (FLEXERIL) 5 MG tablet TK 1 T PO Q 8 H PRF MUSCLE SPASMS    . dicyclomine (BENTYL) 10 MG capsule Take 10 mg by mouth 4 (four) times daily -  before meals and at bedtime.    Marland Kitchen doxycycline (VIBRAMYCIN) 100 MG capsule TK 1 C PO BID  0  . fexofenadine (ALLEGRA) 60 MG tablet Take 60 mg by mouth 2 (two) times daily.    Marland Kitchen FLUoxetine (PROZAC) 20 MG capsule Take 1 capsule (20 mg total) by mouth daily. Take with 40 mg capsules to equal 60 mg daily. 30 capsule 1  . FLUoxetine (PROZAC) 40 MG capsule Take 40 mg by mouth daily.    . fluticasone (FLONASE) 50 MCG/ACT nasal spray SHAKE LQ AND U 2 SPRAYS IEN QD  0  . fluticasone-salmeterol (ADVAIR HFA) 115-21 MCG/ACT inhaler Inhale into the lungs.    .  lidocaine (XYLOCAINE) 2 % solution SWISH AND SPIT 5 ML PO Q 4 H PRN  0  . loratadine (CLARITIN) 10 MG tablet TK 1 T PO QD FOR ALLERGY RELIEF  0  . losartan (COZAAR) 100 MG tablet Take 100 mg by mouth daily.    . mometasone (NASONEX) 50 MCG/ACT nasal spray Place 2 sprays into the nose daily.    . Multiple Vitamin (MULTI VITAMIN DAILY PO) Take by mouth daily.    Marland Kitchen  pantoprazole (PROTONIX) 40 MG tablet     . SUMAtriptan (IMITREX) 50 MG tablet   1  . topiramate (TOPAMAX) 50 MG tablet Take by mouth.     No current facility-administered medications for this visit.    Medical Decision Making:  Established Problem, Stable/Improving (1), New Problem, with no additional work-up planned (3), Review of Medication Regimen & Side Effects (2) and Review of New Medication or Change in Dosage (2) The patient wishes some adjustments to perhaps help him with his energy level and daytime fatigue and sleepiness. Treatment Plan Summary:Medication management   Major depressive disorder, recurrent, moderate Continue  Wellbutrin XL 300 mg in the morning  and  Prozac  60 mg to target any anxiety. He indicated he now has enough 20 mg capsules and he previously at 40 mg capsules. He states he is still awaiting to assess how he likes this dose of 60 before we call in 90 day supplies to [facility].  Other Specified Anxiety Disorder - continue his clonazepam 1 mg twice a day as needed for anxiety appears to be related to finances and physical limitations.   Patient indicates he has enough of his clonazepam at this time. With regards dose increased he will likely need a prescription earlier than his next appointment. He is aware to call for this prescription.     Faith Rogue 02/01/2015, 10:04 AM

## 2015-02-03 NOTE — Progress Notes (Signed)
   THERAPIST PROGRESS NOTE  Session Time: 60  min  Participation Level: Active  Behavioral Response: CasualAlertDepressed  Type of Therapy: Individual Therapy  Treatment Goals addressed: Coping  Interventions: CBT, Motivational Interviewing, Solution Focused, Family Systems and Reframing  Summary: Arthur Howe is a 49 y.o. male who presents with continued family stress and financial concerns.  He states that he continues to struggle with parenting his youngest son due to his behavior in the home and school.  Patient has difficulty with being consistent and providing structure for him.  Patient struggles with paying monthly bills but was able to connect with [facility] (info that was provided at last session).  Discussed stress management strategies.   Suicidal/Homicidal: Nowithout intent/plan  Therapist Response: LCSW provided Patient with ongoing emotional support and encouragement.  Normalized his feelings.  Commended Patient on his progress and reinforced the importance of client staying focused on his own strengths and resources and resiliency. Processed various strategies for dealing with stressors.    Plan: Return again in 2 weeks.  Diagnosis: Axis I: Depression    Axis II: No diagnosis    Lubertha South 02/03/2015

## 2015-03-02 NOTE — Progress Notes (Signed)
Reorder- still takin

## 2015-03-09 ENCOUNTER — Emergency Department: Payer: 59

## 2015-03-09 ENCOUNTER — Other Ambulatory Visit: Payer: Self-pay

## 2015-03-09 ENCOUNTER — Emergency Department
Admission: EM | Admit: 2015-03-09 | Discharge: 2015-03-09 | Disposition: A | Discharge status: Home / Self Care | Payer: 59 | Attending: Student | Admitting: Student

## 2015-03-09 ENCOUNTER — Encounter: Payer: Self-pay | Admitting: Emergency Medicine

## 2015-03-09 DIAGNOSIS — Z79899 Other long term (current) drug therapy: Secondary | ICD-10-CM | POA: Insufficient documentation

## 2015-03-09 DIAGNOSIS — R079 Chest pain, unspecified: Secondary | ICD-10-CM | POA: Diagnosis not present

## 2015-03-09 DIAGNOSIS — Z880 Allergy status to penicillin: Secondary | ICD-10-CM | POA: Insufficient documentation

## 2015-03-09 DIAGNOSIS — R51 Headache: Secondary | ICD-10-CM | POA: Diagnosis not present

## 2015-03-09 DIAGNOSIS — Z7951 Long term (current) use of inhaled steroids: Secondary | ICD-10-CM | POA: Insufficient documentation

## 2015-03-09 DIAGNOSIS — R519 Headache, unspecified: Secondary | ICD-10-CM

## 2015-03-09 DIAGNOSIS — F419 Anxiety disorder, unspecified: Secondary | ICD-10-CM | POA: Diagnosis not present

## 2015-03-09 DIAGNOSIS — I10 Essential (primary) hypertension: Secondary | ICD-10-CM | POA: Diagnosis not present

## 2015-03-09 DIAGNOSIS — R531 Weakness: Secondary | ICD-10-CM | POA: Diagnosis not present

## 2015-03-09 LAB — BASIC METABOLIC PANEL
Anion gap: 5 (ref 5–15)
BUN: 12 mg/dL (ref 6–20)
CO2: 27 mmol/L (ref 22–32)
Calcium: 9.1 mg/dL (ref 8.9–10.3)
Chloride: 109 mmol/L (ref 101–111)
Creatinine, Ser: 1.03 mg/dL (ref 0.61–1.24)
Glucose, Bld: 117 mg/dL — ABNORMAL HIGH (ref 65–99)
POTASSIUM: 4 mmol/L (ref 3.5–5.1)
SODIUM: 141 mmol/L (ref 135–145)

## 2015-03-09 LAB — CBC
HEMATOCRIT: 45.4 % (ref 40.0–52.0)
HEMOGLOBIN: 15.3 g/dL (ref 13.0–18.0)
MCH: 30 pg (ref 26.0–34.0)
MCHC: 33.7 g/dL (ref 32.0–36.0)
MCV: 88.8 fL (ref 80.0–100.0)
Platelets: 224 10*3/uL (ref 150–440)
RBC: 5.12 MIL/uL (ref 4.40–5.90)
RDW: 13.4 % (ref 11.5–14.5)
WBC: 9.2 10*3/uL (ref 3.8–10.6)

## 2015-03-09 LAB — TROPONIN I: Troponin I: <0.03 ng/mL (ref <0.031)

## 2015-03-09 MED ORDER — ALPRAZOLAM 0.5 MG PO TABS
0.5000 mg | ORAL_TABLET | Freq: Once | ORAL | Status: AC
  Administered 2015-03-09: 0.5 mg via ORAL
  Filled 2015-03-09: qty 1

## 2015-03-09 MED ORDER — ASPIRIN 81 MG PO CHEW
324.0000 mg | CHEWABLE_TABLET | Freq: Once | ORAL | Status: AC
  Administered 2015-03-09: 324 mg via ORAL
  Filled 2015-03-09: qty 4

## 2015-03-09 NOTE — ED Notes (Signed)
Pt presents with left sided chest pain started last night while resting. Pt describes as sharp, stabbing pain. Only gets better when lying down.

## 2015-03-09 NOTE — ED Provider Notes (Addendum)
[facility] Emergency Department Provider Note  ____________________________________________  Time seen: Approximately 12:40 PM  I have reviewed the triage vital signs and the nursing notes.   HISTORY  Chief Complaint Chest Pain    HPI Arthur Howe is a 50 y.o. 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 The patient reports that he has been checking his blood pressure religiously and yesterday and today, he was unable to make his blood pressure come down. He reports that he has a history of "conversion disorder" and he was told to come to the emergency department to get checked out whenever his blood pressure was high as this could cause his conversion disorder to worsen. He reports he has had increased right-sided weakness since yesterday as well. He is having some mild occipital headache. No vomiting, diarrhea, fevers or chills. He has been taking all medications as prescribed.   Past Medical History  Diagnosis Date  . Syncope and collapse   . Depression   . Hypertension   . Hyperlipidemia   . Plantar fascial fibromatosis   . Anxiety   . Sleep apnea   . Migraine   . Diverticulitis   . Conversion disorder     Patient Active Problem List   Diagnosis Date Noted  . 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 Diverticulitis 08/17/2014  . 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 Depression, major, severe recurrence ([place]) 08/17/2014  . Acid reflux 10/15/2013  . Headache, migraine 10/15/2013  . Faintness 10/05/2013  . Conversion disorder with abnormal movement 10/05/2013  . Essential hypertension 10/05/2013  . Syncope 10/05/2013  . Depression 10/05/2013  . Headache 10/05/2013  . Anxiety 09/14/2013  . Apnea, sleep  09/14/2013  . LBP (low back pain) 06/12/2013  . Dupuytren's contracture of foot 06/12/2013  . HLD (hyperlipidemia) 06/12/2013  . ESSENTIAL HYPERTENSION, BENIGN 04/16/2007  . COUGH 04/16/2007    Past Surgical History  Procedure Laterality Date  . Gallbladder surgery    . Urethra surgery    . Colon surgery      Current Outpatient Rx  Name  Route  Sig  Dispense  Refill  . albuterol (PROVENTIL HFA;VENTOLIN HFA) 108 (90 BASE) MCG/ACT inhaler   Inhalation   Inhale 2 puffs into the lungs every 6 (six) hours as needed for wheezing or shortness of breath.          Marland Kitchen buPROPion (WELLBUTRIN XL) 300 MG 24 hr tablet   Oral   Take 300 mg by mouth daily.         . clonazePAM (KLONOPIN) 0.5 MG tablet   Oral   Take 1 mg by mouth 3 (three) times daily as needed for anxiety.         . cyclobenzaprine (FLEXERIL) 5 MG tablet   Oral   Take 5 mg by mouth 3 (three) times daily as needed for muscle spasms.         . fexofenadine (ALLEGRA) 180 MG tablet   Oral   Take 180 mg by mouth at bedtime.         Marland Kitchen FLUoxetine (PROZAC) 20 MG capsule   Oral   Take 20 mg by mouth daily. Pt takes with a 40mg  capsule.         Marland Kitchen FLUoxetine (PROZAC) 40 MG capsule   Oral   Take 40  mg by mouth daily. Pt takes with a 20mg  capsule.         . fluticasone (FLONASE) 50 MCG/ACT nasal spray   Each Nare   Place 2 sprays into both nostrils at bedtime.         . Fluticasone-Salmeterol (ADVAIR) 250-50 MCG/DOSE AEPB   Inhalation   Inhale 1 puff into the lungs 2 (two) times daily.         Marland Kitchen losartan (COZAAR) 100 MG tablet   Oral   Take 100 mg by mouth at bedtime.          . Multiple Vitamin (MULTIVITAMIN WITH MINERALS) TABS tablet   Oral   Take 1 tablet by mouth daily.         . pantoprazole (PROTONIX) 40 MG tablet   Oral   Take 40 mg by mouth at bedtime.          . SUMAtriptan (IMITREX) 50 MG tablet   Oral   Take 50 mg by mouth every 2 (two) hours as needed for migraine.       1    . topiramate (TOPAMAX) 50 MG tablet   Oral   Take 50 mg by mouth 2 (two) times daily.            Allergies Amoxicillin and Penicillins  Family History  Problem Relation Age of Onset  . Arrhythmia Maternal Grandmother     heart murmur   . Hypertension Maternal Grandmother   . Hypertension Mother   . Anxiety disorder Mother   . Heart failure Father   . Bipolar disorder Paternal Uncle   . Schizophrenia Paternal Uncle     Social History Social History  Substance Use Topics  . Smoking status: Never Smoker   . Smokeless tobacco: Never Used  . Alcohol Use: No    Review of Systems Constitutional: No fever/chills Eyes: No visual changes. ENT: No sore throat. Cardiovascular: + chest pain. Respiratory: Denies shortness of breath. Gastrointestinal: No abdominal pain.  No nausea, no vomiting.  No diarrhea.  No constipation. Genitourinary: Negative for dysuria. Musculoskeletal: Negative for back pain. Skin: Negative for rash. Neurological: Positive for headache, positive for chronic right arm and leg focal weakness or  10-point ROS otherwise negative.  ____________________________________________   PHYSICAL EXAM:  VITAL SIGNS: ED Triage Vitals  Enc Vitals Group     BP 03/09/15 0836 157/104 mmHg     Pulse Rate 03/09/15 0836 87     Resp 03/09/15 0836 20     Temp 03/09/15 0836 97.6 F (36.4 C)     Temp Source 03/09/15 0836 Oral     SpO2 03/09/15 0836 94 %     Weight 03/09/15 0836 230 lb (104.327 kg)     Height 03/09/15 0836 6' (1.829 m)     Head Cir --      Peak Flow --      Pain Score 03/09/15 0834 7     Pain Loc --      Pain Edu? --      Excl. in [place]? --     Constitutional: Alert and oriented. Appears anxious with intermittent hours however this resolved with distraction. Eyes: Conjunctivae are normal. PERRL. EOMI. Head: Atraumatic. Nose: No congestion/rhinnorhea. Mouth/Throat: Mucous membranes are moist.  Oropharynx non-erythematous. Neck: No stridor.   Cardiovascular: Normal rate, regular rhythm. Grossly normal heart sounds.  Good peripheral circulation. Respiratory: Normal respiratory effort.  No retractions. Lungs CTAB. Gastrointestinal: Soft and nontender. No distention. No CVA tenderness. Genitourinary:  deferred Musculoskeletal: No lower extremity tenderness nor edema.  No joint effusions. Neurologic:  Normal speech and language. Cranial nerves 2 through 12 are intact. The patient has 5 out of 5 strength in bilateral upper and lower extremities though he requires significant encouragement to fully engage the right upper and right lower extremity and strength exam. He has chronic decreased sensation to light touch in the right arm and leg. Skin:  Skin is warm, dry and intact. No rash noted. Psychiatric: Mood and affect are normal. Speech and behavior are normal.  ____________________________________________   LABS (all labs ordered are listed, but only abnormal results are displayed)  Labs Reviewed  BASIC METABOLIC PANEL - Abnormal; Notable for the following:    Glucose, Bld 117 (*)    All other components within normal limits  TROPONIN I  CBC  TROPONIN I   ____________________________________________  EKG  ED ECG REPORT I, Joanne Gavel, the attending physician, personally viewed and interpreted this ECG.   Date: 03/09/2015  EKG Time: 08:36  Rate: 77  Rhythm: normal EKG, normal sinus rhythm  Axis: normal  Intervals:none  ST&T Change: No acute ST elevation  ____________________________________________  RADIOLOGY  CXR IMPRESSION: No active cardiopulmonary disease.  CT head IMPRESSION: No acute abnormality noted. ____________________________________________   PROCEDURES  Procedure(s) performed: None  Critical Care performed: No  ____________________________________________   INITIAL IMPRESSION / ASSESSMENT AND PLAN / ED COURSE  Pertinent labs & imaging results that were available during my care of  the patient were reviewed by me and considered in my medical decision making (see chart for details).  Arthur Howe is a 50 y.o. 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 On exam, he is well-appearing but does appear anxious from time to time. Moderately hypertensive with blood pressure 165/104. EKG reassuring, not consistent with ischemia, troponin negative, we'll plan for delta troponin though I doubt ACS. Not consistent with PE or acute aortic dissection. CT head pending. Chest x-ray clear. Normal CBC and BMP. I suspect his symptoms today may be anxiety related given that he had very similar presentation in 2014 and was admitted for full stroke workup, had a negative MRI at that time and his symptoms were thought to be related to complex migraine as well as anxiety. With encouragement, he has an intact neurological examination and I doubt acute CVA. Will give Xanax and reassess for disposition.  ----------------------------------------- 2:46 PM on 03/09/2015 ----------------------------------------- Patient reports that his pain has improved at this time after Xanax. Second troponin is negative, doubt ACS. CT head is unremarkable. Blood pressure is 153/115 however when manually assessed, blood pressure is 160/77 and I am more inclined to believe the manual blood pressure is accurate. I discussed his presentation today as well as his blood pressure with his primary care doctor, Dr. Lovie Macadamia who agrees that lowering his blood pressure any further at this time would likely be more risky than beneficial as we could cause watershed infarct and as his systolic blood pressure is not significantly elevated. Dr.  Lovie Macadamia agrees with discharge and he follow up with the patient in clinic and help facilitate expedient follow-up. I discussed this with the patient as well as return precautions and need for close PCP follow-up in the next 1-2 days  and he is comfortable with the discharge plan. DC home.  ____________________________________________   FINAL CLINICAL IMPRESSION(S) / ED DIAGNOSES  Final diagnoses:  Essential hypertension  Chest pain, unspecified chest pain type  Acute nonintractable headache, unspecified headache type  Weakness      Joanne Gavel, MD 03/09/15 Florence Ronesha Heenan, MD 03/09/15 1456

## 2015-03-31 ENCOUNTER — Ambulatory Visit: Payer: 59 | Admitting: Psychiatry

## 2015-03-31 ENCOUNTER — Ambulatory Visit: Payer: 59 | Admitting: Licensed Clinical Social Worker

## 2015-04-05 ENCOUNTER — Ambulatory Visit (INDEPENDENT_AMBULATORY_CARE_PROVIDER_SITE_OTHER): Payer: 59 | Admitting: Psychiatry

## 2015-04-05 ENCOUNTER — Encounter: Payer: Self-pay | Admitting: Psychiatry

## 2015-04-05 ENCOUNTER — Telehealth: Payer: Self-pay

## 2015-04-05 ENCOUNTER — Ambulatory Visit (INDEPENDENT_AMBULATORY_CARE_PROVIDER_SITE_OTHER): Payer: 59 | Admitting: Licensed Clinical Social Worker

## 2015-04-05 VITALS — BP 124/88 | HR 114 | Temp 97.1°F | Ht 72.0 in | Wt 232.8 lb

## 2015-04-05 DIAGNOSIS — F332 Major depressive disorder, recurrent severe without psychotic features: Secondary | ICD-10-CM | POA: Diagnosis not present

## 2015-04-05 DIAGNOSIS — F411 Generalized anxiety disorder: Secondary | ICD-10-CM | POA: Diagnosis not present

## 2015-04-05 MED ORDER — BUPROPION HCL ER (XL) 300 MG PO TB24: 300.0000 mg | ORAL_TABLET | Freq: Every day | ORAL | Status: DC

## 2015-04-05 MED ORDER — CLONAZEPAM 1 MG PO TABS: ORAL_TABLET | ORAL | Status: DC

## 2015-04-05 MED ORDER — FLUOXETINE HCL 60 MG PO TABS: 60.0000 mg | ORAL_TABLET | ORAL | Status: DC

## 2015-04-05 NOTE — Telephone Encounter (Signed)
rx faxed and confrimed-  klonopin 1.g id # [id number] order # [id number]

## 2015-04-05 NOTE — Progress Notes (Signed)
[facility] MD/PA/NP OP Progress Note  04/05/2015 11:04 AM Arthur Howe  MRN:  [id number]  Subjective: Patient returns for follow-up was major depressive disorder. He indicated spent a difficult month. He states sometime around January 10 he had high blood pressure. He states usually can take a clonazepam and it resolves however this time he did it in and continue to remain elevated. He states he and his wife made a decision to go the emergency room. He states he was given some alprazolam while there. He states the emergency room doctor contact his primary care physician and was informed about his conversion disorder. He states they made a decision not to try to drop his blood pressure extensively for fear of causing a stroke. Patient indicated typically has a pattern where his blood pressure will return to normal within about 7 days and this is in fact what happened. Patient then states that later this month he had a bad headache which persisted and he took some of his migraine medication which was ineffective and had to take some clonazepam. Eyes any stressors in his life that occurred around the time of these events.  We discussed that perhaps given these issues that as opposed to waiting for the issue and taking clonazepam that perhaps he needs to make it a standing dose. He does feel like the Wellbutrin and Prozac have been helpful for his mood and is not having issue so much with depression at this time.  He did indicate he has an attorney who he is working with an regards to his disability.  Chief Complaint: good Chief Complaint    Follow-up; Medication Refill; Headache; Anxiety; Stress     argument Visit Diagnosis:     ICD-9-CM ICD-10-CM   1. Major depressive disorder, recurrent, severe without psychotic features ([place]) 296.33 F33.2   2. Anxiety state 300.00 F41.1     Past Medical History:  Past Medical History  Diagnosis Date  . Syncope and collapse   . Depression   . Hypertension   .  Hyperlipidemia   . Plantar fascial fibromatosis   . Anxiety   . Sleep apnea   . Migraine   . Diverticulitis   . Conversion disorder     Past Surgical History  Procedure Laterality Date  . Gallbladder surgery    . Urethra surgery    . Colon surgery     Family History:  Family History  Problem Relation Age of Onset  . Arrhythmia Maternal Grandmother     heart murmur   . Hypertension Maternal Grandmother   . Hypertension Mother   . Anxiety disorder Mother   . Heart failure Father   . Bipolar disorder Paternal Uncle   . Schizophrenia Paternal Uncle    Social History:  Social History   Social History  . Marital Status: Married    Spouse Name: N/A  . Number of Children: N/A  . Years of Education: N/A   Social History Main Topics  . Smoking status: Never Smoker   . Smokeless tobacco: Never Used  . Alcohol Use: No  . Drug Use: No  . Sexual Activity: Yes   Other Topics Concern  . None   Social History Narrative   Additional History:   Assessment:   Musculoskeletal: Strength & Muscle Tone: within normal limits Gait & Station: Slow and ambulating with a cane Patient leans: N/A  Psychiatric Specialty Exam: Headache  Pertinent negatives include no insomnia.  Anxiety Symptoms include nervous/anxious behavior. Patient reports no insomnia  or suicidal ideas.      Review of Systems  Neurological: Positive for headaches.  Psychiatric/Behavioral: Negative for depression, suicidal ideas, hallucinations, memory loss and substance abuse. The patient is nervous/anxious. The patient does not have insomnia.   All other systems reviewed and are negative.   Blood pressure 124/88, pulse 114, temperature 97.1 F (36.2 C), temperature source Tympanic, height 6' (1.829 m), weight 232 lb 12.8 oz (105.597 kg), SpO2 94 %.Body mass index is 31.57 kg/(m^2).  General Appearance: Neat and Well Groomed  Eye Contact:  Good  Speech:  Normal Rate  Volume:  Normal  Mood:  I did not do  well  Affect:  Anxious  Thought Process:  Linear and Logical  Orientation:  Full (Time, Place, and Person)  Thought Content:  Negative  Suicidal Thoughts:  No  Homicidal Thoughts:  No  Memory:  Immediate;   Good Recent;   Good Remote;   Fair  Judgement:  Good  Insight:  Good  Psychomotor Activity:  Negative  Concentration:  Good  Recall:  Good  Fund of Knowledge: Good  Language: Good  Akathisia:  Negative  Handed:  Right unknown  AIMS (if indicated):  Not done  Assets:  Communication Skills Desire for Improvement Social Support  ADL's:  Intact  Cognition: WNL  Sleep:  excessive   Is the patient at risk to self?  No. Has the patient been a risk to self in the past 6 months?  No. Has the patient been a risk to self within the distant past?  No. Is the patient a risk to others?  No. Has the patient been a risk to others in the past 6 months?  No. Has the patient been a risk to others within the distant past?  No.  Current Medications: Current Outpatient Prescriptions  Medication Sig Dispense Refill  . ADVAIR HFA 115-21 MCG/ACT inhaler INL 2 PFS INTO THE LUNGS Q 12 H  5  . albuterol (PROVENTIL HFA;VENTOLIN HFA) 108 (90 BASE) MCG/ACT inhaler Inhale 2 puffs into the lungs every 6 (six) hours as needed for wheezing or shortness of breath.     Marland Kitchen buPROPion (WELLBUTRIN XL) 300 MG 24 hr tablet Take 1 tablet (300 mg total) by mouth daily. 90 tablet 1  . clonazePAM (KLONOPIN) 1 MG tablet One half a tablet in the morning and one tablet at bedtime. Can take one half a tablet as needed. 180 tablet 0  . cyclobenzaprine (FLEXERIL) 5 MG tablet Take 5 mg by mouth 3 (three) times daily as needed for muscle spasms.    . fexofenadine (ALLEGRA) 180 MG tablet Take 180 mg by mouth at bedtime.    . fluticasone (FLONASE) 50 MCG/ACT nasal spray Place 2 sprays into both nostrils at bedtime.    . Fluticasone-Salmeterol (ADVAIR) 250-50 MCG/DOSE AEPB Inhale 1 puff into the lungs 2 (two) times daily.    Marland Kitchen  losartan (COZAAR) 100 MG tablet Take 100 mg by mouth at bedtime.     . metoprolol succinate (TOPROL-XL) 25 MG 24 hr tablet TK 1 T PO QD  1  . Multiple Vitamin (MULTIVITAMIN WITH MINERALS) TABS tablet Take 1 tablet by mouth daily.    . pantoprazole (PROTONIX) 40 MG tablet Take 40 mg by mouth at bedtime.     . SUMAtriptan (IMITREX) 50 MG tablet Take 50 mg by mouth every 2 (two) hours as needed for migraine.   1  . topiramate (TOPAMAX) 50 MG tablet Take 50 mg by mouth 2 (two)  times daily.     Marland Kitchen FLUoxetine HCl 60 MG TABS Take 60 mg by mouth every morning. 90 tablet 1   No current facility-administered medications for this visit.    Medical Decision Making:  Established Problem, Stable/Improving (1), New Problem, with no additional work-up planned (3), Review of Medication Regimen & Side Effects (2) and Review of New Medication or Change in Dosage (2) The patient wishes some adjustments to perhaps help him with his energy level and daytime fatigue and sleepiness. Treatment Plan Summary:Medication management   Major depressive disorder, recurrent, moderate Continue  Wellbutrin XL 300 mg in the morning  and  Prozac  60 mg to target any anxiety. He indicated he now has enough 20 mg capsules and he previously at 40 mg capsules. He states he is still awaiting to assess how he likes this dose of 60 before we call in 90 day supplies to [facility].  Other Specified Anxiety Disorder - continue his clonazepam. Patient had typically only been taking the dose at night time. I've now encouraged him to take standing doses of one half a milligram in the morning and 1 mg at bedtime. He will take an additional half a milligram daily as needed.  Patient has been informed of my departure and that he will see a new psychiatrist at his next appointment. I've encouraged him to call any questions or concerns prior to his next appointment.Faith Rogue 04/05/2015, 11:04 AM

## 2015-04-20 NOTE — Progress Notes (Signed)
   THERAPIST PROGRESS NOTE  Session Time:66min  Participation Level: Active  Behavioral Response: CasualAlertDepressed  Type of Therapy: Individual Therapy  Treatment Goals addressed: Coping and Diagnosis: Depression  Interventions: CBT, Motivational Interviewing and Solution Focused  Summary: Arthur Howe is a 50 y.o. male who presents with continued symptoms of his diagnosis.  Therapist discussed reduction in aggression.  Therapist facilitated a discussion on "managing conflict."  Therapist discussed with consumer the importance of using decision making skills in the process of daily conflicts and situations.  Therapist discussed the importance of conflict management is found in the realities of everyday living.  Therapist assisted consumer with defining conflict and management.  Therapist assisted with processing the skill components: 1. list your choices 2. list your consequences. 3. decide on the choice that has the least negative consequence. 4. Ask yourself if that choice is fair and conscientious.   Therapist modeled the skill.   Therapist completed a role play with consumer to assist with understanding the skills.  Therapist encouraged consumer to use skill to decrease incidents verbal aggression with others. Therapist assessed progress toward goal.  Suicidal/Homicidal: Nowithout intent/plan  Therapist Response: LCSW provided Patient with ongoing emotional support and encouragement.  Normalized feelings.  Commended Patient on progress and reinforced the importance of client staying focused on his own strengths and resources and resiliency. Processed various strategies for dealing with stressors.    Plan: Return again in 4 weeks.  Diagnosis: Axis I: Depression    Axis II: No diagnosis    Lubertha South 04/06/2015

## 2015-05-03 ENCOUNTER — Ambulatory Visit (INDEPENDENT_AMBULATORY_CARE_PROVIDER_SITE_OTHER): Payer: 59 | Admitting: Psychiatry

## 2015-05-03 ENCOUNTER — Encounter: Payer: Self-pay | Admitting: Psychiatry

## 2015-05-03 ENCOUNTER — Ambulatory Visit: Payer: 59 | Admitting: Licensed Clinical Social Worker

## 2015-05-03 ENCOUNTER — Ambulatory Visit: Payer: 59 | Admitting: Psychiatry

## 2015-05-03 DIAGNOSIS — F331 Major depressive disorder, recurrent, moderate: Secondary | ICD-10-CM

## 2015-05-03 MED ORDER — FLUOXETINE HCL 20 MG PO CAPS: 20.0000 mg | ORAL_CAPSULE | Freq: Every day | ORAL | Status: DC

## 2015-05-03 NOTE — Progress Notes (Signed)
Patient ID: Arthur Howe, male   DOB: 1965-05-21, 50 y.o.   MRN: [id number] [facility] MD/PA/NP OP Progress Note  05/03/2015 10:48 AM MOTT HUMBLE  MRN:  [id number]  Subjective: Patient returns for follow-up was major depressive disorder. He was previously seen by Dr. Jimmye Norman. He continues to report mood symptoms. States he has been more irritable, crying more. States he is sleeping well.  Feeling exhausted all the time. He has been very emotional. Feels like he has responded better to Prozac in terms of his mood. Denies any suicidal thought. States he has not been able to spend time with his family like before. States that his symptoms started around October-November of last year when he had a big fallout with his father-in-law. Feels like he has not been able to regain his mood since then.  Chief Complaint: Not doing well Chief Complaint    Follow-up; Medication Problem     argument Visit Diagnosis:   No diagnosis found.  Past Medical History:  Past Medical History  Diagnosis Date  . Syncope and collapse   . Depression   . Hypertension   . Hyperlipidemia   . Plantar fascial fibromatosis   . Anxiety   . Sleep apnea   . Migraine   . Diverticulitis   . Conversion disorder     Past Surgical History  Procedure Laterality Date  . Gallbladder surgery    . Urethra surgery    . Colon surgery     Family History:  Family History  Problem Relation Age of Onset  . Arrhythmia Maternal Grandmother     heart murmur   . Hypertension Maternal Grandmother   . Hypertension Mother   . Anxiety disorder Mother   . Heart failure Father   . Bipolar disorder Paternal Uncle   . Schizophrenia Paternal Uncle    Social History:  Social History   Social History  . Marital Status: Married    Spouse Name: N/A  . Number of Children: N/A  . Years of Education: N/A   Social History Main Topics  . Smoking status: Never Smoker   . Smokeless tobacco: Never Used  . Alcohol Use: No  . Drug Use: No   . Sexual Activity: Yes   Other Topics Concern  . None   Social History Narrative   Additional History:   Assessment:   Musculoskeletal: Strength & Muscle Tone: within normal limits Gait & Station: Slow and ambulating with a cane Patient leans: N/A  Psychiatric Specialty Exam: Headache  Pertinent negatives include no insomnia.  Anxiety Symptoms include nervous/anxious behavior. Patient reports no insomnia or suicidal ideas.      Review of Systems  Neurological: Positive for headaches.  Psychiatric/Behavioral: Negative for depression, suicidal ideas, hallucinations, memory loss and substance abuse. The patient is nervous/anxious. The patient does not have insomnia.   All other systems reviewed and are negative.   Blood pressure 122/70, pulse 95, temperature 97.5 F (36.4 C), temperature source Tympanic, height 6' (1.829 m), weight 236 lb (107.049 kg), SpO2 93 %.Body mass index is 32 kg/(m^2).  General Appearance: Neat and Well Groomed  Eye Contact:  Good  Speech:  Normal Rate  Volume:  Normal  Mood:  I did not do well  Affect:  Anxious  Thought Process:  Linear and Logical  Orientation:  Full (Time, Place, and Person)  Thought Content:  Negative  Suicidal Thoughts:  No  Homicidal Thoughts:  No  Memory:  Immediate;   Good Recent;   Good  Remote;   Fair  Judgement:  Good  Insight:  Good  Psychomotor Activity:  Negative  Concentration:  Good  Recall:  Good  Fund of Knowledge: Good  Language: Good  Akathisia:  Negative  Handed:  Right unknown  AIMS (if indicated):  Not done  Assets:  Communication Skills Desire for Improvement Social Support  ADL's:  Intact  Cognition: WNL  Sleep:  excessive   Is the patient at risk to self?  No. Has the patient been a risk to self in the past 6 months?  No. Has the patient been a risk to self within the distant past?  No. Is the patient a risk to others?  No. Has the patient been a risk to others in the past 6 months?   No. Has the patient been a risk to others within the distant past?  No.  Current Medications: Current Outpatient Prescriptions  Medication Sig Dispense Refill  . ADVAIR HFA 115-21 MCG/ACT inhaler INL 2 PFS INTO THE LUNGS Q 12 H  5  . albuterol (PROVENTIL HFA;VENTOLIN HFA) 108 (90 BASE) MCG/ACT inhaler Inhale 2 puffs into the lungs every 6 (six) hours as needed for wheezing or shortness of breath.     Marland Kitchen buPROPion (WELLBUTRIN XL) 300 MG 24 hr tablet Take 1 tablet (300 mg total) by mouth daily. 90 tablet 1  . clonazePAM (KLONOPIN) 1 MG tablet One half a tablet in the morning and one tablet at bedtime. Can take one half a tablet as needed. 180 tablet 0  . cyclobenzaprine (FLEXERIL) 5 MG tablet Take 5 mg by mouth 3 (three) times daily as needed for muscle spasms.    . fexofenadine (ALLEGRA) 180 MG tablet Take 180 mg by mouth at bedtime.    Marland Kitchen FLUoxetine HCl 60 MG TABS Take 60 mg by mouth every morning. 90 tablet 1  . fluticasone (FLONASE) 50 MCG/ACT nasal spray Place 2 sprays into both nostrils at bedtime.    . Fluticasone-Salmeterol (ADVAIR) 250-50 MCG/DOSE AEPB Inhale 1 puff into the lungs 2 (two) times daily.    Marland Kitchen losartan (COZAAR) 100 MG tablet Take 100 mg by mouth at bedtime.     . metoprolol succinate (TOPROL-XL) 25 MG 24 hr tablet TK 1 T PO QD  1  . Multiple Vitamin (MULTIVITAMIN WITH MINERALS) TABS tablet Take 1 tablet by mouth daily.    . pantoprazole (PROTONIX) 40 MG tablet Take 40 mg by mouth at bedtime.     . SUMAtriptan (IMITREX) 50 MG tablet Take 50 mg by mouth every 2 (two) hours as needed for migraine.   1  . topiramate (TOPAMAX) 50 MG tablet Take 50 mg by mouth 2 (two) times daily.      No current facility-administered medications for this visit.    Medical Decision Making:  Established Problem, Stable/Improving (1), New Problem, with no additional work-up planned (3), Review of Medication Regimen & Side Effects (2) and Review of New Medication or Change in Dosage (2) The patient  wishes some adjustments to perhaps help him with his energy level and daytime fatigue and sleepiness. Treatment Plan Summary:Medication management   Major depressive disorder, recurrent, moderate  Continue  Wellbutrin XL 300 mg in the morning. Increase  Prozac to 80 mg to target depression.  Other Specified Anxiety Disorder - continue his clonazepam at 0.5mg  po qam and 1mg  at bedtime. Helps him with sleeping better.  RTC in 1 month or call before if necessary.    Dannika Hilgeman 05/03/2015, 10:48 AM

## 2015-05-06 NOTE — Progress Notes (Signed)
refilled 

## 2015-05-06 NOTE — Progress Notes (Signed)
Refilled

## 2015-05-09 ENCOUNTER — Encounter: Payer: Self-pay | Admitting: Emergency Medicine

## 2015-05-09 ENCOUNTER — Emergency Department: Payer: 59

## 2015-05-09 ENCOUNTER — Emergency Department
Admission: EM | Admit: 2015-05-09 | Discharge: 2015-05-09 | Disposition: A | Discharge status: Home / Self Care | Payer: 59 | Attending: Emergency Medicine | Admitting: Emergency Medicine

## 2015-05-09 DIAGNOSIS — Y9301 Activity, walking, marching and hiking: Secondary | ICD-10-CM | POA: Insufficient documentation

## 2015-05-09 DIAGNOSIS — Z79899 Other long term (current) drug therapy: Secondary | ICD-10-CM | POA: Diagnosis not present

## 2015-05-09 DIAGNOSIS — S4991XA Unspecified injury of right shoulder and upper arm, initial encounter: Secondary | ICD-10-CM | POA: Diagnosis not present

## 2015-05-09 DIAGNOSIS — I10 Essential (primary) hypertension: Secondary | ICD-10-CM | POA: Diagnosis not present

## 2015-05-09 DIAGNOSIS — Y9289 Other specified places as the place of occurrence of the external cause: Secondary | ICD-10-CM | POA: Insufficient documentation

## 2015-05-09 DIAGNOSIS — W208XXA Other cause of strike by thrown, projected or falling object, initial encounter: Secondary | ICD-10-CM | POA: Insufficient documentation

## 2015-05-09 DIAGNOSIS — Z880 Allergy status to penicillin: Secondary | ICD-10-CM | POA: Insufficient documentation

## 2015-05-09 DIAGNOSIS — R55 Syncope and collapse: Secondary | ICD-10-CM | POA: Diagnosis not present

## 2015-05-09 DIAGNOSIS — S20219A Contusion of unspecified front wall of thorax, initial encounter: Secondary | ICD-10-CM | POA: Insufficient documentation

## 2015-05-09 DIAGNOSIS — Z7951 Long term (current) use of inhaled steroids: Secondary | ICD-10-CM | POA: Insufficient documentation

## 2015-05-09 DIAGNOSIS — Y998 Other external cause status: Secondary | ICD-10-CM | POA: Diagnosis not present

## 2015-05-09 LAB — CBC WITH DIFFERENTIAL/PLATELET
Basophils Absolute: 0.1 10*3/uL (ref 0–0.1)
Basophils Relative: 1 %
Eosinophils Absolute: 0.9 10*3/uL — ABNORMAL HIGH (ref 0–0.7)
Eosinophils Relative: 11 %
HCT: 44.7 % (ref 40.0–52.0)
Hemoglobin: 15.3 g/dL (ref 13.0–18.0)
LYMPHS ABS: 1.7 10*3/uL (ref 1.0–3.6)
LYMPHS PCT: 19 %
MCH: 31.2 pg (ref 26.0–34.0)
MCHC: 34.3 g/dL (ref 32.0–36.0)
MCV: 91 fL (ref 80.0–100.0)
Monocytes Absolute: 0.7 10*3/uL (ref 0.2–1.0)
Monocytes Relative: 8 %
Neutro Abs: 5.4 10*3/uL (ref 1.4–6.5)
Neutrophils Relative %: 61 %
Platelets: 203 10*3/uL (ref 150–440)
RBC: 4.91 MIL/uL (ref 4.40–5.90)
RDW: 13.8 % (ref 11.5–14.5)
WBC: 8.8 10*3/uL (ref 3.8–10.6)

## 2015-05-09 LAB — COMPREHENSIVE METABOLIC PANEL
ALBUMIN: 4.4 g/dL (ref 3.5–5.0)
ALK PHOS: 67 U/L (ref 38–126)
ALT: 38 U/L (ref 17–63)
ANION GAP: 7 (ref 5–15)
AST: 32 U/L (ref 15–41)
BUN: 18 mg/dL (ref 6–20)
CHLORIDE: 108 mmol/L (ref 101–111)
CO2: 23 mmol/L (ref 22–32)
Calcium: 8.9 mg/dL (ref 8.9–10.3)
Creatinine, Ser: 1.16 mg/dL (ref 0.61–1.24)
GFR calc Af Amer: >60 mL/min (ref >60)
GFR calc non Af Amer: >60 mL/min (ref >60)
Glucose, Bld: 127 mg/dL — ABNORMAL HIGH (ref 65–99)
Potassium: 3.8 mmol/L (ref 3.5–5.1)
SODIUM: 138 mmol/L (ref 135–145)
Total Bilirubin: 0.8 mg/dL (ref 0.3–1.2)
Total Protein: 7.6 g/dL (ref 6.5–8.1)

## 2015-05-09 LAB — TROPONIN I: Troponin I: <0.03 ng/mL (ref <0.031)

## 2015-05-09 NOTE — ED Notes (Signed)
States was walking and felt dizzy, awoke on floor with microwave on top of his chest.  Per son when patient awoke he was alert and knew him.

## 2015-05-09 NOTE — Discharge Instructions (Signed)
Syncope °Syncope is a medical term for fainting or passing out. This means you lose consciousness and drop to the ground. People are generally unconscious for less than 5 minutes. You may have some muscle twitches for up to 15 seconds before waking up and returning to normal. Syncope occurs more often in older adults, but it can happen to anyone. While most causes of syncope are not dangerous, syncope can be a sign of a serious medical problem. It is important to seek medical care.  °CAUSES  °Syncope is caused by a sudden drop in blood flow to the brain. The specific cause is often not determined. Factors that can bring on syncope include: °· Taking medicines that lower blood pressure. °· Sudden changes in posture, such as standing up quickly. °· Taking more medicine than prescribed. °· Standing in one place for too long. °· Seizure disorders. °· Dehydration and excessive exposure to heat. °· Low blood sugar (hypoglycemia). °· Straining to have a bowel movement. °· Heart disease, irregular heartbeat, or other circulatory problems. °· Fear, emotional distress, seeing blood, or severe pain. °SYMPTOMS  °Right before fainting, you may: °· Feel dizzy or light-headed. °· Feel nauseous. °· See all white or all black in your field of vision. °· Have cold, clammy skin. °DIAGNOSIS  °Your health care provider will ask about your symptoms, perform a physical exam, and perform an electrocardiogram (ECG) to record the electrical activity of your heart. Your health care provider may also perform other heart or blood tests to determine the cause of your syncope which may include: °· Transthoracic echocardiogram (TTE). During echocardiography, sound waves are used to evaluate how blood flows through your heart. °· Transesophageal echocardiogram (TEE). °· Cardiac monitoring. This allows your health care provider to monitor your heart rate and rhythm in real time. °· Holter monitor. This is a portable device that records your  heartbeat and can help diagnose heart arrhythmias. It allows your health care provider to track your heart activity for several days, if needed. °· Stress tests by exercise or by giving medicine that makes the heart beat faster. °TREATMENT  °In most cases, no treatment is needed. Depending on the cause of your syncope, your health care provider may recommend changing or stopping some of your medicines. °HOME CARE INSTRUCTIONS °· Have someone stay with you until you feel stable. °· Do not drive, use machinery, or play sports until your health care provider says it is okay. °· Keep all follow-up appointments as directed by your health care provider. °· Lie down right away if you start feeling like you might faint. Breathe deeply and steadily. Wait until all the symptoms have passed. °· Drink enough fluids to keep your urine clear or pale yellow. °· If you are taking blood pressure or heart medicine, get up slowly and take several minutes to sit and then stand. This can reduce dizziness. °SEEK IMMEDIATE MEDICAL CARE IF:  °· You have a severe headache. °· You have unusual pain in the chest, abdomen, or back. °· You are bleeding from your mouth or rectum, or you have black or tarry stool. °· You have an irregular or very fast heartbeat. °· You have pain with breathing. °· You have repeated fainting or seizure-like jerking during an episode. °· You faint when sitting or lying down. °· You have confusion. °· You have trouble walking. °· You have severe weakness. °· You have vision problems. °If you fainted, call your local emergency services (911 in [place] Do not drive   yourself to the hospital.    This information is not intended to replace advice given to you by your health care provider. Make sure you discuss any questions you have with your health care provider.   Document Released: 02/19/2005 Document Revised: 07/06/2014 Document Reviewed: 04/20/2011 Elsevier Interactive Patient Education [employer].  Please return immediately if condition worsens. Please contact her primary physician or the physician you were given for referral. If you have any specialist physicians involved in her treatment and plan please also contact them. Thank you for using [facility].

## 2015-05-09 NOTE — ED Notes (Signed)
AAOx3.  Skin warm and dry.  NAD>  Ambulates with easy and steady gait with cane assist.

## 2015-05-09 NOTE — ED Provider Notes (Signed)
Time Seen: Approximately 2200 I have reviewed the triage notes  Chief Complaint: Loss of Consciousness   History of Present Illness: Arthur Howe is a 50 y.o. male *who states a long history of syncopal episodes of unknown etiology. He states he's had syncopal episodes since he was 50 years old. Patient had a syncopal episode today while carrying some cans in the kitchen. He states he had some feelings of lightheadedness and loss consciousness. He pulled a microwave down on top of him. He states that he woke up with some chest discomfort and right shoulder pain. The patient denies any presyncopal chest pain or feelings of heart palpitations etc. He states he feels improved at this point outside of chest and right shoulder soreness. There was no witnessed seizure activity and did not bite his tongue, urinate, or defecate on himself.   Past Medical History  Diagnosis Date  . Syncope and collapse   . Depression   . Hypertension   . Hyperlipidemia   . Plantar fascial fibromatosis   . Anxiety   . Sleep apnea   . Migraine   . Diverticulitis   . Conversion disorder     Patient Active Problem List   Diagnosis Date Noted  . 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 Diverticulitis 08/17/2014  . 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 Depression, major, severe recurrence ([place]) 08/17/2014  . Acid reflux 10/15/2013  . Headache, migraine 10/15/2013  . Faintness 10/05/2013  . Conversion disorder with abnormal movement 10/05/2013  . Essential hypertension 10/05/2013  . Syncope 10/05/2013  . Depression 10/05/2013  . Headache 10/05/2013  . Anxiety 09/14/2013  . Apnea, sleep 09/14/2013  . Clinical depression 09/14/2013  . LBP (low back pain) 06/12/2013  . Dupuytren's contracture of foot 06/12/2013  . HLD (hyperlipidemia) 06/12/2013  . ESSENTIAL HYPERTENSION, BENIGN 04/16/2007  . COUGH 04/16/2007    Past Surgical History  Procedure Laterality Date  . Gallbladder surgery    . Urethra surgery     . Colon surgery      Past Surgical History  Procedure Laterality Date  . Gallbladder surgery    . Urethra surgery    . Colon surgery      Current Outpatient Rx  Name  Route  Sig  Dispense  Refill  . ADVAIR HFA 115-21 MCG/ACT inhaler      INL 2 PFS INTO THE LUNGS Q 12 H      5     Dispense as written.   Marland Kitchen albuterol (PROVENTIL HFA;VENTOLIN HFA) 108 (90 BASE) MCG/ACT inhaler   Inhalation   Inhale 2 puffs into the lungs every 6 (six) hours as needed for wheezing or shortness of breath.          Marland Kitchen buPROPion (WELLBUTRIN XL) 300 MG 24 hr tablet   Oral   Take 1 tablet (300 mg total) by mouth daily.   90 tablet   1   . clonazePAM (KLONOPIN) 1 MG tablet      One half a tablet in the morning and one tablet at bedtime. Can take one half a tablet as needed.   180 tablet   0   . cyclobenzaprine (FLEXERIL) 5 MG tablet   Oral   Take 5 mg by mouth 3 (three) times daily as needed for muscle spasms.         . fexofenadine (ALLEGRA) 180 MG tablet   Oral   Take 180 mg by mouth at bedtime.         Marland Kitchen FLUoxetine (  PROZAC) 20 MG capsule   Oral   Take 1 capsule (20 mg total) by mouth daily.   30 capsule   2   . FLUoxetine HCl 60 MG TABS   Oral   Take 60 mg by mouth every morning.   90 tablet   1   . fluticasone (FLONASE) 50 MCG/ACT nasal spray   Each Nare   Place 2 sprays into both nostrils at bedtime.         . Fluticasone-Salmeterol (ADVAIR) 250-50 MCG/DOSE AEPB   Inhalation   Inhale 1 puff into the lungs 2 (two) times daily.         Marland Kitchen losartan (COZAAR) 100 MG tablet   Oral   Take 100 mg by mouth at bedtime.          . metoprolol succinate (TOPROL-XL) 25 MG 24 hr tablet      TK 1 T PO QD      1   . Multiple Vitamin (MULTIVITAMIN WITH MINERALS) TABS tablet   Oral   Take 1 tablet by mouth daily.         . pantoprazole (PROTONIX) 40 MG tablet   Oral   Take 40 mg by mouth at bedtime.          . SUMAtriptan (IMITREX) 50 MG tablet   Oral   Take  50 mg by mouth every 2 (two) hours as needed for migraine.       1   . topiramate (TOPAMAX) 50 MG tablet   Oral   Take 50 mg by mouth 2 (two) times daily.            Allergies:  Amoxicillin and Penicillins  Family History: Family History  Problem Relation Age of Onset  . Arrhythmia Maternal Grandmother     heart murmur   . Hypertension Maternal Grandmother   . Hypertension Mother   . Anxiety disorder Mother   . Heart failure Father   . Bipolar disorder Paternal Uncle   . Schizophrenia Paternal Uncle     Social History: Social History  Substance Use Topics  . Smoking status: Never Smoker   . Smokeless tobacco: Never Used  . Alcohol Use: No     Review of Systems:   10 point review of systems was performed and was otherwise negative:  Constitutional: No fever Eyes: No visual disturbances ENT: No sore throat, ear pain Cardiac: Patient has some chest wall pain Respiratory: No shortness of breath, wheezing, or stridor Abdomen: No abdominal pain, no vomiting, No diarrhea Endocrine: No weight loss, No night sweats Extremities: No peripheral edema, cyanosis Skin: No rashes, easy bruising Neurologic: No focal weakness, trouble with speech or swollowing Urologic: No dysuria, Hematuria, or urinary frequency He denies any head trauma or focal posterior neck discomfort  Physical Exam:  ED Triage Vitals  Enc Vitals Group     BP 05/09/15 1831 134/88 mmHg     Pulse Rate 05/09/15 1831 80     Resp 05/09/15 1831 18     Temp 05/09/15 1831 98.2 F (36.8 C)     Temp Source 05/09/15 1831 Oral     SpO2 05/09/15 1831 95 %     Weight 05/09/15 1831 234 lb (106.142 kg)     Height 05/09/15 1831 6' (1.829 m)     Head Cir --      Peak Flow --      Pain Score 05/09/15 1832 7     Pain Loc --  Pain Edu? --      Excl. in [place]? --     General: Awake , Alert , and Oriented times 3; GCS 15 Head: Normal cephalic , atraumatic Eyes: Pupils equal , round, reactive to  light Nose/Throat: No nasal drainage, patent upper airway without erythema or exudate.  Neck: Supple, Full range of motion, No anterior adenopathy or palpable thyroid masses Lungs: Clear to ascultation without wheezes , rhonchi, or rales Heart: Regular rate, regular rhythm without murmurs , gallops , or rubs Abdomen: Soft, non tender without rebound, guarding , or rigidity; bowel sounds positive and symmetric in all 4 quadrants. No organomegaly .        Extremities: Mild reproducible pain over the right chest wall region with full range of motion at the right shoulder without any crepitus or step-off noted 2 plus symmetric pulses. No edema, clubbing or cyanosis Neurologic: normal ambulation, Motor symmetric without deficits, sensory intact Skin: warm, dry, no rashes   Labs:   All laboratory work was reviewed including any pertinent negatives or positives listed below:  Labs Reviewed  COMPREHENSIVE METABOLIC PANEL - Abnormal; Notable for the following:    Glucose, Bld 127 (*)    All other components within normal limits  CBC WITH DIFFERENTIAL/PLATELET - Abnormal; Notable for the following:    Eosinophils Absolute 0.9 (*)    All other components within normal limits  TROPONIN I   reviewed the patient's laboratory work shows no significant findings  EKG:    ED ECG REPORT I, Daymon Larsen, the attending physician, personally viewed and interpreted this ECG.  Date: 05/09/2015 EKG Time: 1839 Rate: 77 Rhythm: normal sinus rhythm QRS Axis: normal Intervals: normal ST/T Wave abnormalities: normal Conduction Disturbances: none Narrative Interpretation: unremarkable Normal EKG   Radiology: *   Narrative:   CLINICAL DATA: . Chest injury. Syncope  EXAM: CHEST 2 VIEW  COMPARISON: 03/09/2015  FINDINGS: The heart size and mediastinal contours are within normal limits. Both lungs are clear. The visualized skeletal structures are unremarkable.  IMPRESSION: No active  cardiopulmonary disease.   Electronically Signed By: Franchot Gallo M.D. On: 05/09/2015 19:16          DG Shoulder Right (Final result) Result time: 05/09/15 19:17:54   Final result by Rad Results In Interface (05/09/15 19:17:54)   Narrative:   CLINICAL DATA: Patient with syncopal episode. Right shoulder pain status post fall.  EXAM: RIGHT SHOULDER - 2+ VIEW  COMPARISON: Spot radiographs 10/02/2014  FINDINGS: Normal anatomic alignment. No evidence for acute fracture or dislocation. Mild AC joint degenerative changes.  IMPRESSION: No acute osseous abnormality.   Electronically Signed By: Lovey Newcomer M.D.       I personally reviewed the radiologic studies    ED Course: * Patient's stay was uneventful and he felt symptomatically improved. Given his history of felt this was unlikely to be cardiogenic syncope. I did refer him to a cardiologist for outpatient testing. Patient appears to just have a simple chest wall contusion at this point. I felt further studies such as a chest CT, etc. was not necessary. He has no history of pulmonary embolism or risk factors and I felt would be stable for discharge. He was advised take over-the-counter ibuprofen and Tylenol for pain and was advised to return here if he has any other new concerns.    Assessment:  Syncope Chest wall contusion  Final Clinical Impression:   Final diagnoses:  Syncope, non cardiac     Plan: * Outpatient management Patient  was advised to return immediately if condition worsens. Patient was advised to follow up with their primary care physician or other specialized physicians involved in their outpatient care *            Daymon Larsen, MD 05/09/15 2345

## 2015-05-30 ENCOUNTER — Encounter: Payer: Self-pay | Admitting: Psychiatry

## 2015-05-30 ENCOUNTER — Ambulatory Visit (INDEPENDENT_AMBULATORY_CARE_PROVIDER_SITE_OTHER): Payer: 59 | Admitting: Psychiatry

## 2015-05-30 ENCOUNTER — Other Ambulatory Visit (HOSPITAL_COMMUNITY): Payer: Self-pay | Admitting: Psychiatry

## 2015-05-30 VITALS — BP 122/80 | HR 80 | Temp 97.7°F | Ht 72.0 in | Wt 232.4 lb

## 2015-05-30 DIAGNOSIS — F411 Generalized anxiety disorder: Secondary | ICD-10-CM

## 2015-05-30 DIAGNOSIS — F332 Major depressive disorder, recurrent severe without psychotic features: Secondary | ICD-10-CM | POA: Diagnosis not present

## 2015-05-30 MED ORDER — TRAZODONE HCL 50 MG PO TABS: 50.0000 mg | ORAL_TABLET | Freq: Every evening | ORAL | Status: DC | PRN

## 2015-05-30 NOTE — Progress Notes (Signed)
Patient ID: Arthur Howe, male   DOB: Feb 20, 1966, 50 y.o.   MRN: [id number]  [facility] MD/PA/NP OP Progress Note  05/30/2015 8:43 AM Arthur Howe  MRN:  [id number]  Subjective: Patient returns for follow-up was major depressive disorder and Conversion disorder. Today he is accompanied by his wife. States he has been more irritable, crying more. States that he has been disoriented. He reports that the while he was at that she cheese pizza he became disoriented and just started walking around. He reports he is having trouble sleeping and has not been eating well. Last time we had increased his Prozac to 80 mg since he reported that he had responded better to the Prozac in terms of his mood. He denies any suicidal ideations.  His wife reports that previously he was able to volunteer at the church and be engaged on some level. She states that he has decompensated  over the past few months. Over past few weeks  she feels like he has  been more depressed. Patient reports that 2 weeks ago he became dizzy no the microwave and he had a fall in the microwave fell on him. States that he will did go to the ER but they did not find any injuries. Discussed with patient and his wife that the Klonopin could be contributing to some of his dizziness and memory issues. They are okay with tapering off the Klonopin and making other med changes.   Chief Complaint: Not doing well Chief Complaint    Follow-up; Medication Refill; Panic Attack; Anxiety; Depression; Fatigue; Stress; Hallucinations     argument Visit Diagnosis:   No diagnosis found.  Past Medical History:  Past Medical History  Diagnosis Date  . Syncope and collapse   . Depression   . Hypertension   . Hyperlipidemia   . Plantar fascial fibromatosis   . Anxiety   . Sleep apnea   . Migraine   . Diverticulitis   . Conversion disorder     Past Surgical History  Procedure Laterality Date  . Gallbladder surgery    . Urethra surgery    . Colon  surgery     Family History:  Family History  Problem Relation Age of Onset  . Arrhythmia Maternal Grandmother     heart murmur   . Hypertension Maternal Grandmother   . Hypertension Mother   . Anxiety disorder Mother   . Heart failure Father   . Bipolar disorder Paternal Uncle   . Schizophrenia Paternal Uncle    Social History:  Social History   Social History  . Marital Status: Married    Spouse Name: N/A  . Number of Children: N/A  . Years of Education: N/A   Social History Main Topics  . Smoking status: Never Smoker   . Smokeless tobacco: Never Used  . Alcohol Use: No  . Drug Use: No  . Sexual Activity: Yes   Other Topics Concern  . None   Social History Narrative   Additional History:   Assessment:   Musculoskeletal: Strength & Muscle Tone: within normal limits Gait & Station: Slow and ambulating with a cane Patient leans: N/A  Psychiatric Specialty Exam: Anxiety Symptoms include nervous/anxious behavior. Patient reports no insomnia or suicidal ideas.    Depression        Associated symptoms include headaches.  Associated symptoms include does not have insomnia and no suicidal ideas.  Past medical history includes anxiety.   Headache  Pertinent negatives include no insomnia.  Review of Systems  Neurological: Positive for headaches.  Psychiatric/Behavioral: Positive for depression. Negative for suicidal ideas, hallucinations, memory loss and substance abuse. The patient is nervous/anxious. The patient does not have insomnia.   All other systems reviewed and are negative.   Blood pressure 122/80, pulse 80, temperature 97.7 F (36.5 C), temperature source Tympanic, height 6' (1.829 m), weight 232 lb 6.4 oz (105.416 kg), SpO2 96 %.Body mass index is 31.51 kg/(m^2).  General Appearance: Neat and Well Groomed  Eye Contact:  Good  Speech:  Normal Rate  Volume:  Normal  Mood:  Depressed   Affect:  Anxious  Thought Process:  Linear and Logical   Orientation:  Full (Time, Place, and Person)  Thought Content:  Negative  Suicidal Thoughts:  No  Homicidal Thoughts:  No  Memory:  Immediate;   Good Recent;   Good Remote;   Fair  Judgement:  Good  Insight:  Good  Psychomotor Activity:  Negative  Concentration:  Good  Recall:  Good  Fund of Knowledge: Good  Language: Good  Akathisia:  Negative  Handed:  Right unknown  AIMS (if indicated):  Not done  Assets:  Communication Skills Desire for Improvement Social Support  ADL's:  Intact  Cognition: WNL  Sleep:  excessive   Is the patient at risk to self?  No. Has the patient been a risk to self in the past 6 months?  No. Has the patient been a risk to self within the distant past?  No. Is the patient a risk to others?  No. Has the patient been a risk to others in the past 6 months?  No. Has the patient been a risk to others within the distant past?  No.  Current Medications: Current Outpatient Prescriptions  Medication Sig Dispense Refill  . albuterol (PROVENTIL HFA;VENTOLIN HFA) 108 (90 BASE) MCG/ACT inhaler Inhale 2 puffs into the lungs every 6 (six) hours as needed for wheezing or shortness of breath.     Marland Kitchen buPROPion (WELLBUTRIN XL) 300 MG 24 hr tablet Take 1 tablet (300 mg total) by mouth daily. 90 tablet 1  . clonazePAM (KLONOPIN) 1 MG tablet One half a tablet in the morning and one tablet at bedtime. Can take one half a tablet as needed. 180 tablet 0  . cyclobenzaprine (FLEXERIL) 5 MG tablet Take 5 mg by mouth 3 (three) times daily as needed for muscle spasms.    . fexofenadine (ALLEGRA) 180 MG tablet Take 180 mg by mouth at bedtime.    Marland Kitchen FLUoxetine (PROZAC) 20 MG capsule Take 1 capsule (20 mg total) by mouth daily. 30 capsule 2  . FLUoxetine HCl 60 MG TABS Take 60 mg by mouth every morning. 90 tablet 1  . fluticasone (FLONASE) 50 MCG/ACT nasal spray Place 2 sprays into both nostrils at bedtime.    . Fluticasone-Salmeterol (ADVAIR) 250-50 MCG/DOSE AEPB Inhale 1 puff into  the lungs 2 (two) times daily.    Marland Kitchen losartan (COZAAR) 100 MG tablet Take 100 mg by mouth at bedtime.     . metoprolol succinate (TOPROL-XL) 25 MG 24 hr tablet TK 1 T PO QD  1  . Multiple Vitamin (MULTIVITAMIN WITH MINERALS) TABS tablet Take 1 tablet by mouth daily.    . pantoprazole (PROTONIX) 40 MG tablet Take 40 mg by mouth at bedtime.     . SUMAtriptan (IMITREX) 50 MG tablet Take 50 mg by mouth every 2 (two) hours as needed for migraine.   1  . topiramate (TOPAMAX) 50  MG tablet Take 50 mg by mouth 2 (two) times daily.      No current facility-administered medications for this visit.    Medical Decision Making:  Established Problem, Stable/Improving (1), New Problem, with no additional work-up planned (3), Review of Medication Regimen & Side Effects (2) and Review of New Medication or Change in Dosage (2) The patient wishes some adjustments to perhaps help him with his energy level and daytime fatigue and sleepiness. Treatment Plan Summary:Medication management   Major depressive disorder, recurrent, moderate  Continue  Wellbutrin XL 300 mg in the morning. Decrease  Prozac to 40 mg For 2 weeks and then decrease to 20 mg for 2 weeks and then discontinue.  Other Specified Anxiety Disorder -  Decrease clonazepam to 0.5 mg twice daily for 2 weeks and then take 0.5 mg at nighttime for 2 weeks and discontinue  Insomnia Start trazodone at 50 mg by mouth at bedtime Patient made aware off side effects of grogginess and priapism.  RTC in 1 month or call before if necessary.    Tylyn Derwin 05/30/2015, 8:43 AM

## 2015-06-02 ENCOUNTER — Other Ambulatory Visit (HOSPITAL_COMMUNITY): Payer: Self-pay | Admitting: Psychiatry

## 2015-06-13 ENCOUNTER — Encounter: Payer: Self-pay | Admitting: Psychiatry

## 2015-06-13 ENCOUNTER — Inpatient Hospital Stay
Admission: EM | Admit: 2015-06-13 | Discharge: 2015-06-20 | DRG: 885 | Disposition: A | Discharge status: Home / Self Care | Payer: 59 | Source: Intra-hospital | Attending: Psychiatry | Admitting: Psychiatry

## 2015-06-13 ENCOUNTER — Ambulatory Visit (INDEPENDENT_AMBULATORY_CARE_PROVIDER_SITE_OTHER): Payer: 59 | Admitting: Psychiatry

## 2015-06-13 VITALS — BP 118/72 | HR 100 | Temp 97.4°F | Ht 72.0 in | Wt 234.4 lb

## 2015-06-13 DIAGNOSIS — F4310 Post-traumatic stress disorder, unspecified: Secondary | ICD-10-CM | POA: Diagnosis present

## 2015-06-13 DIAGNOSIS — K219 Gastro-esophageal reflux disease without esophagitis: Secondary | ICD-10-CM | POA: Diagnosis present

## 2015-06-13 DIAGNOSIS — Z818 Family history of other mental and behavioral disorders: Secondary | ICD-10-CM

## 2015-06-13 DIAGNOSIS — G4700 Insomnia, unspecified: Secondary | ICD-10-CM | POA: Diagnosis present

## 2015-06-13 DIAGNOSIS — G4730 Sleep apnea, unspecified: Secondary | ICD-10-CM | POA: Diagnosis present

## 2015-06-13 DIAGNOSIS — F332 Major depressive disorder, recurrent severe without psychotic features: Secondary | ICD-10-CM

## 2015-06-13 DIAGNOSIS — N390 Urinary tract infection, site not specified: Secondary | ICD-10-CM | POA: Diagnosis present

## 2015-06-13 DIAGNOSIS — R45851 Suicidal ideations: Secondary | ICD-10-CM | POA: Diagnosis not present

## 2015-06-13 DIAGNOSIS — F329 Major depressive disorder, single episode, unspecified: Secondary | ICD-10-CM | POA: Diagnosis present

## 2015-06-13 DIAGNOSIS — J449 Chronic obstructive pulmonary disease, unspecified: Secondary | ICD-10-CM | POA: Diagnosis present

## 2015-06-13 DIAGNOSIS — I10 Essential (primary) hypertension: Secondary | ICD-10-CM | POA: Diagnosis present

## 2015-06-13 DIAGNOSIS — G43909 Migraine, unspecified, not intractable, without status migrainosus: Secondary | ICD-10-CM | POA: Diagnosis present

## 2015-06-13 DIAGNOSIS — Z79899 Other long term (current) drug therapy: Secondary | ICD-10-CM

## 2015-06-13 DIAGNOSIS — F411 Generalized anxiety disorder: Secondary | ICD-10-CM | POA: Diagnosis not present

## 2015-06-13 LAB — LIPID PANEL
CHOL/HDL RATIO: 4.4 ratio
Cholesterol: 190 mg/dL (ref 0–200)
HDL: 43 mg/dL (ref >40)
LDL Cholesterol: 101 mg/dL — ABNORMAL HIGH (ref 0–99)
Triglycerides: 231 mg/dL — ABNORMAL HIGH (ref <150)
VLDL: 46 mg/dL — ABNORMAL HIGH (ref 0–40)

## 2015-06-13 LAB — TSH: TSH: 0.887 u[IU]/mL (ref 0.350–4.500)

## 2015-06-13 MED ORDER — FLUTICASONE PROPIONATE 50 MCG/ACT NA SUSP
2.0000 | Freq: Every day | NASAL | Status: DC
  Administered 2015-06-13 – 2015-06-19 (×8): 2 via NASAL
  Filled 2015-06-13: qty 16

## 2015-06-13 MED ORDER — CLONAZEPAM 1 MG PO TABS
1.0000 mg | ORAL_TABLET | Freq: Two times a day (BID) | ORAL | Status: DC | PRN
  Administered 2015-06-13: 1 mg via ORAL
  Filled 2015-06-13: qty 1

## 2015-06-13 MED ORDER — ALUM & MAG HYDROXIDE-SIMETH 200-200-20 MG/5ML PO SUSP
30.0000 mL | ORAL | Status: DC | PRN
  Administered 2015-06-16 – 2015-06-19 (×2): 30 mL via ORAL
  Filled 2015-06-13 (×2): qty 30

## 2015-06-13 MED ORDER — FLUOXETINE HCL 20 MG PO CAPS: 20.0000 mg | ORAL_CAPSULE | Freq: Every day | ORAL | Status: DC

## 2015-06-13 MED ORDER — SUMATRIPTAN SUCCINATE 50 MG PO TABS
50.0000 mg | ORAL_TABLET | ORAL | Status: DC | PRN
  Filled 2015-06-13: qty 1

## 2015-06-13 MED ORDER — BUPROPION HCL ER (XL) 150 MG PO TB24
300.0000 mg | ORAL_TABLET | Freq: Every day | ORAL | Status: DC
  Administered 2015-06-13 – 2015-06-20 (×8): 300 mg via ORAL
  Filled 2015-06-13 (×8): qty 2

## 2015-06-13 MED ORDER — FLUOXETINE HCL 20 MG PO CAPS
60.0000 mg | ORAL_CAPSULE | Freq: Every day | ORAL | Status: DC
  Administered 2015-06-14 – 2015-06-15 (×2): 60 mg via ORAL
  Filled 2015-06-13 (×2): qty 3

## 2015-06-13 MED ORDER — PANTOPRAZOLE SODIUM 40 MG PO TBEC
40.0000 mg | DELAYED_RELEASE_TABLET | Freq: Every day | ORAL | Status: DC
  Administered 2015-06-13 – 2015-06-16 (×5): 40 mg via ORAL
  Filled 2015-06-13 (×5): qty 1

## 2015-06-13 MED ORDER — TRAZODONE HCL 100 MG PO TABS
100.0000 mg | ORAL_TABLET | Freq: Every day | ORAL | Status: DC
  Administered 2015-06-13 – 2015-06-19 (×7): 100 mg via ORAL
  Filled 2015-06-13 (×7): qty 1

## 2015-06-13 MED ORDER — MOMETASONE FURO-FORMOTEROL FUM 200-5 MCG/ACT IN AERO
2.0000 | INHALATION_SPRAY | Freq: Two times a day (BID) | RESPIRATORY_TRACT | Status: DC
  Administered 2015-06-14 – 2015-06-20 (×13): 2 via RESPIRATORY_TRACT
  Filled 2015-06-13: qty 8.8

## 2015-06-13 MED ORDER — LORATADINE 10 MG PO TABS
10.0000 mg | ORAL_TABLET | Freq: Every day | ORAL | Status: DC
  Administered 2015-06-13 – 2015-06-20 (×8): 10 mg via ORAL
  Filled 2015-06-13 (×8): qty 1

## 2015-06-13 MED ORDER — ACETAMINOPHEN 325 MG PO TABS
650.0000 mg | ORAL_TABLET | Freq: Four times a day (QID) | ORAL | Status: DC | PRN
  Administered 2015-06-16: 650 mg via ORAL
  Filled 2015-06-13: qty 2

## 2015-06-13 MED ORDER — METOPROLOL SUCCINATE ER 25 MG PO TB24
25.0000 mg | ORAL_TABLET | Freq: Every day | ORAL | Status: DC
  Administered 2015-06-13 – 2015-06-20 (×8): 25 mg via ORAL
  Filled 2015-06-13 (×9): qty 1

## 2015-06-13 MED ORDER — TOPIRAMATE 100 MG PO TABS
100.0000 mg | ORAL_TABLET | Freq: Every day | ORAL | Status: DC
  Administered 2015-06-13 – 2015-06-19 (×7): 100 mg via ORAL
  Filled 2015-06-13 (×7): qty 1

## 2015-06-13 MED ORDER — MAGNESIUM HYDROXIDE 400 MG/5ML PO SUSP: 30.0000 mL | Freq: Every day | ORAL | Status: DC | PRN

## 2015-06-13 MED ORDER — LOSARTAN POTASSIUM 50 MG PO TABS
100.0000 mg | ORAL_TABLET | Freq: Every day | ORAL | Status: DC
  Administered 2015-06-13 – 2015-06-20 (×8): 100 mg via ORAL
  Filled 2015-06-13 (×9): qty 2

## 2015-06-13 MED ORDER — ALBUTEROL SULFATE HFA 108 (90 BASE) MCG/ACT IN AERS
2.0000 | INHALATION_SPRAY | Freq: Four times a day (QID) | RESPIRATORY_TRACT | Status: DC | PRN
  Filled 2015-06-13: qty 6.7

## 2015-06-13 NOTE — Tx Team (Signed)
Initial Interdisciplinary Treatment Plan   PATIENT STRESSORS: Financial difficulties Health problems   PATIENT STRENGTHS: Average or above average intelligence Communication skills Supportive family/friends   PROBLEM LIST: Problem List/Patient Goals Date to be addressed Date deferred Reason deferred Estimated date of resolution  Depression 06/13/2015     Suicidal ideation 06/13/2015                                                DISCHARGE CRITERIA:  Adequate post-discharge living arrangements Improved stabilization in mood, thinking, and/or behavior Medical problems require only outpatient monitoring Verbal commitment to aftercare and medication compliance  PRELIMINARY DISCHARGE PLAN: Attend aftercare/continuing care group Return to previous living arrangement  PATIENT/FAMIILY INVOLVEMENT: This treatment plan has been presented to and reviewed with the patient, Arthur Howe, and/or family member,   The patient and family have been given the opportunity to ask questions and make suggestions.  Arthur Howe Arthur Howe 06/13/2015, 5:02 PM

## 2015-06-13 NOTE — Progress Notes (Signed)
Patient ID: Arthur Howe, male   DOB: [birth year], 50 y.o.   MRN: [id number]   [facility] MD/PA/NP OP Progress Note  06/13/2015 1:05 PM Arthur Howe  MRN:  [id number]  Subjective: Patient returns for follow-up was major depressive disorder and Conversion disorder. Patient called today stating that he was having more negative thoughts. He reports that on tapering the Prozac initially to 40 and now he is at 20mg 's been feeling more depressed and having suicidal thoughts. States that he feels like he does not want to be here anymore. He is worried about his finances. Patient is very keen on obtaining disability and the both he and his wife are asking this clinician as to why disability paperwork cannot be done since it was the started by Dr. Jimmye Norman. It was explained to them that his depression and anxiety of not on stabilized and unless that is treated because not look at disability status. Patient and his wife upset at this. Patient then started crying and saying he can't take it anymore and he is falling apart. Discussed hospitalization to stabilize patient's mood and for his safety. Patient agreeable to this plan.  Patient also reported that he is having more trouble walking. Given patient's history of conversion disorder not sure what is leading to his walking problems. Patient has been worked up by neurology and they did not find any etiology for his difficulty walking. Patient also reports feeling disoriented and having the crying spells. Discussed with patient and his wife that these of the symptoms they patient was presenting with since that this clinician started seeing him in the middle of February. They had reported that patient started decompensating in October 2016 and we are  trying to tweak his medications to stabilize his mood. Patient's wife reported being frustrated at the the lack of progress with patient and upset about the changes in medication. It was discussed with her and the patient that  medications have to be adjusted until we find the right combination and dosage. We discussed that patient will benefit from seeing a different psychiatrist after his inpatient hospitalization and they're agreeable to this plan.    Chief Complaint: Having suicidal thoughts   Visit Diagnosis:   No diagnosis found.  Past Medical History:  Past Medical History  Diagnosis Date  . Syncope and collapse   . Depression   . Hypertension   . Hyperlipidemia   . Plantar fascial fibromatosis   . Anxiety   . Sleep apnea   . Migraine   . Diverticulitis   . Conversion disorder     Past Surgical History  Procedure Laterality Date  . Gallbladder surgery    . Urethra surgery    . Colon surgery     Family History:  Family History  Problem Relation Age of Onset  . Arrhythmia Maternal Grandmother     heart murmur   . Hypertension Maternal Grandmother   . Hypertension Mother   . Anxiety disorder Mother   . Heart failure Father   . Bipolar disorder Paternal Uncle   . Schizophrenia Paternal Uncle    Social History:  Social History   Social History  . Marital Status: Married    Spouse Name: N/A  . Number of Children: N/A  . Years of Education: N/A   Social History Main Topics  . Smoking status: Never Smoker   . Smokeless tobacco: Never Used  . Alcohol Use: No  . Drug Use: No  . Sexual Activity: Yes  Other Topics Concern  . Not on file   Social History Narrative   Additional History:   Assessment:   Musculoskeletal: Strength & Muscle Tone: within normal limits Gait & Station: Slow and ambulating with a cane Patient leans: N/A  Psychiatric Specialty Exam: Anxiety Symptoms include nervous/anxious behavior. Patient reports no insomnia or suicidal ideas.    Depression        Associated symptoms include headaches.  Associated symptoms include does not have insomnia and no suicidal ideas.  Past medical history includes anxiety.   Headache  Pertinent negatives include no  insomnia.    Review of Systems  Neurological: Positive for headaches.  Psychiatric/Behavioral: Positive for depression. Negative for suicidal ideas, hallucinations, memory loss and substance abuse. The patient is nervous/anxious. The patient does not have insomnia.   All other systems reviewed and are negative.   There were no vitals taken for this visit.There is no weight on file to calculate BMI.  General Appearance: Neat and Well Groomed  Eye Contact:  Good  Speech:  Normal Rate  Volume:  Normal  Mood:  Depressed   Affect:  Anxious and crying   Thought Process:  Linear and Logical  Orientation:  Full (Time, Place, and Person)  Thought Content:  Negative  Suicidal Thoughts:  Yes with no plan or intent   Homicidal Thoughts:  No  Memory:  Immediate;   Good Recent;   Good Remote;   Fair  Judgement:  Good  Insight:  Good  Psychomotor Activity:  Negative  Concentration:  Good  Recall:  Good  Fund of Knowledge: Good  Language: Good  Akathisia:  Negative  Handed:  Right unknown  AIMS (if indicated):  Not done  Assets:  Communication Skills Desire for Improvement Social Support  ADL's:  Intact  Cognition: WNL  Sleep:  excessive   Is the patient at risk to self?  No. Has the patient been a risk to self in the past 6 months?  No. Has the patient been a risk to self within the distant past?  No. Is the patient a risk to others?  No. Has the patient been a risk to others in the past 6 months?  No. Has the patient been a risk to others within the distant past?  No.  Current Medications: Current Outpatient Prescriptions  Medication Sig Dispense Refill  . albuterol (PROVENTIL HFA;VENTOLIN HFA) 108 (90 BASE) MCG/ACT inhaler Inhale 2 puffs into the lungs every 6 (six) hours as needed for wheezing or shortness of breath.     Marland Kitchen buPROPion (WELLBUTRIN XL) 300 MG 24 hr tablet Take 1 tablet (300 mg total) by mouth daily. 90 tablet 1  . clonazePAM (KLONOPIN) 1 MG tablet One half a tablet  in the morning and one tablet at bedtime. Can take one half a tablet as needed. 180 tablet 0  . cyclobenzaprine (FLEXERIL) 5 MG tablet Take 5 mg by mouth 3 (three) times daily as needed for muscle spasms.    . fexofenadine (ALLEGRA) 180 MG tablet Take 180 mg by mouth at bedtime.    . fluticasone (FLONASE) 50 MCG/ACT nasal spray Place 2 sprays into both nostrils at bedtime.    . Fluticasone-Salmeterol (ADVAIR) 250-50 MCG/DOSE AEPB Inhale 1 puff into the lungs 2 (two) times daily.    Marland Kitchen losartan (COZAAR) 100 MG tablet Take 100 mg by mouth at bedtime.     . metoprolol succinate (TOPROL-XL) 25 MG 24 hr tablet TK 1 T PO QD  1  .  Multiple Vitamin (MULTIVITAMIN WITH MINERALS) TABS tablet Take 1 tablet by mouth daily.    . pantoprazole (PROTONIX) 40 MG tablet Take 40 mg by mouth at bedtime.     . SUMAtriptan (IMITREX) 50 MG tablet Take 50 mg by mouth every 2 (two) hours as needed for migraine.   1  . topiramate (TOPAMAX) 50 MG tablet Take 50 mg by mouth 2 (two) times daily.      No current facility-administered medications for this visit.    Medical Decision Making:  Established Problem, Stable/Improving (1), New Problem, with no additional work-up planned (3), Review of Medication Regimen & Side Effects (2) and Review of New Medication or Change in Dosage (2) The patient wishes some adjustments to perhaps help him with his energy level and daytime fatigue and sleepiness. Treatment Plan Summary:Medication management   Major depressive disorder, recurrent, moderate  Continue  Wellbutrin XL 300 mg in the morning. Patient currently on Prozac at 20 mg dosage, any increases at the discretion of the inpatient psychiatrist. Please review my previous notes for rationale to increase Prozac to 80 mg initially and then taper off the Prozac,.  Other Specified Anxiety Disorder Patient has been able to taper to clonazepam at 0.5 mg once daily.  Patient was accompanied by his wife and this clinician to the  inpatient unit. Case was discussed with Dr.Pucilowska who is the attending psychiatrist today and accepted to the unit. Once patient is stabilized and ready for discharge it was discussed with Dr. Mamie Nick that patient will transition to a different psychiatrist.    Elvin So 06/13/2015, 1:05 PM

## 2015-06-13 NOTE — [facility] Group Notes (Signed)
[facility] Group Notes:  (Nursing/MHT/Case Management/Adjunct)  Date:  06/13/2015  Time:  10:34 PM  Type of Therapy:  Evening Wrap-up Group  Participation Level:  Minimal  Participation Quality:  Appropriate and Attentive  Affect:  Appropriate  Cognitive:  Alert and Appropriate  Insight:  Appropriate and Good  Engagement in Group:  Engaged  Modes of Intervention:  Discussion  Summary of Progress/Problems:  Levonne Spiller 06/13/2015, 10:34 PM

## 2015-06-13 NOTE — [facility] Suicide Risk Assessment (Signed)
[facility] Admission Suicide Risk Assessment   Nursing information obtained from:    Demographic factors:    Current Mental Status:    Loss Factors:    Historical Factors:    Risk Reduction Factors:     Total Time spent with patient: 1 hour Principal Problem: Major depressive disorder, recurrent severe without psychotic features ([facility]) Diagnosis:   Patient Active Problem List   Diagnosis Date Noted  . Diverticulitis [K57.92] 08/17/2014  . Acid reflux [K21.9] 10/15/2013  . Headache, migraine [G43.909] 10/15/2013  . 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 Essential hypertension [I10] 10/05/2013  . Syncope [R55] 10/05/2013  . Anxiety [F41.9] 09/14/2013  . Apnea, sleep [G47.30] 09/14/2013  . Major depressive disorder, recurrent severe without psychotic features ([place]) [F33.2] 09/14/2013  . LBP (low back pain) [M54.5] 06/12/2013  . Dupuytren's contracture of foot [M72.2] 06/12/2013  . HLD (hyperlipidemia) [E78.5] 06/12/2013   Subjective Data: Depression, anxiety, auditory hallucinations, suicidal ideation.  Continued Clinical Symptoms:  Alcohol Use Disorder Identification Test Final Score (AUDIT): 0 The "Alcohol Use Disorders Identification Test", Guidelines for Use in Primary Care, Second Edition.  World Pharmacologist [facility]). Score between 0-7:  no or low risk or alcohol related problems. Score between 8-15:  moderate risk of alcohol related problems. Score between 16-19:  high risk of alcohol related problems. Score 20 or above:  warrants further diagnostic evaluation for alcohol dependence and treatment.   CLINICAL FACTORS:   Severe Anxiety and/or Agitation Depression:   Insomnia Severe Currently Psychotic   Musculoskeletal: Strength & Muscle Tone: within normal limits and decreased Gait & Station: unsteady Patient leans: N/A  Psychiatric Specialty Exam: Review of Systems  All other systems reviewed and are negative.   Blood pressure 144/96,  pulse 96, temperature 97.7 F (36.5 C), temperature source Oral, resp. rate 18, height 5\' 9"  (1.753 m), weight 59.421 kg (131 lb), SpO2 99 %.Body mass index is 19.34 kg/(m^2).  General Appearance: Casual  Eye Contact::  Good  Speech:  Clear and Coherent  Volume:  Normal  Mood:  Anxious  Affect:  Appropriate  Thought Process:  Goal Directed  Orientation:  Full (Time, Place, and Person)  Thought Content:  Hallucinations: Auditory Command:  Telling him to kill himself.  Suicidal Thoughts:  Yes.  with intent/plan  Homicidal Thoughts:  No  Memory:  Immediate;   Fair Recent;   Fair Remote;   Fair  Judgement:  Fair  Insight:  Fair  Psychomotor Activity:  Normal  Concentration:  Fair  Recall:  [employer]  Language: Fair  Akathisia:  No  Handed:  Right  AIMS (if indicated):     Assets:  Communication Skills Desire for Improvement Financial Resources/Insurance Housing Resilience Social Support  Sleep:     Cognition: WNL  ADL's:  Intact    COGNITIVE FEATURES THAT CONTRIBUTE TO RISK:  None    SUICIDE RISK:   Severe:  Frequent, intense, and enduring suicidal ideation, specific plan, no subjective intent, but some objective markers of intent (i.e., choice of lethal method), the method is accessible, some limited preparatory behavior, evidence of impaired self-control, severe dysphoria/symptomatology, multiple risk factors present, and few if any protective factors, particularly a lack of social support.  PLAN OF CARE: Hospital admission, medication management, discharge planning.  Arthur Howe is a 50 year old male with a history of depression, anxiety, and conversion disorder admitting for worsening of Arthur symptoms and new onset auditory command hallucinations telling him self in the context of  medication readjustment and severe social stressors.  1. Suicidal ideation. The patient is able to contract for safety in the hospital.  2. Mood. He has been maintained on a  combination of Wellbutrin and Prozac. Arthur Prozac has been recently lowered the patient feels that this was detrimental. Will increase Prozac to 60 mg again.  3. Anxiety. He was maintained on Klonopin 1 mg twice daily. When this was lowered the patient started experiencing severe anxiety and worsening of depression.  4. COPD. He is on albuterol and spirit.  5. Allergies. He is on Advertising account planner.  6. Migraine headache. He is on Topamax for headache prevention and Imitrex.  7. Hypertension. He is on Toprol-XL and Cozaar.  8. Insomnia. He is on trazodone.  9. Sleep apnea. Arthur Howe will bring Arthur CPAP machine.  10. Disposition. He will be discharged to home with family. He will need a follow-up appointment with a new psychiatrist  I certify that inpatient services furnished can reasonably be expected to improve the patient's condition.   Orson Slick, MD 06/13/2015, 3:32 PM

## 2015-06-13 NOTE — Progress Notes (Signed)
D: Pt denies SI/HI, +ve AVH- command. Pt is pleasant and cooperative. Pt stated he felt a little better today. Pt mobility has been adequate on the unit at this time.   A: Pt was offered support and encouragement. Pt was given scheduled medications. Pt was encourage to attend groups. Q 15 minute checks were done for safety.   R:Pt attends groups and interacts well with peers and staff. Pt is taking medication. Pt has no complaints at this time.Pt receptive to treatment and safety maintained on unit.

## 2015-06-13 NOTE — Progress Notes (Signed)
50yrs old male admitted with depression & suicidal ideations.His gait is unsteady with R side weakness due to conversion disorder.Uses walker for ambulation.He is high fall risk.Skin assessment & body search done,no contraband found.

## 2015-06-13 NOTE — Plan of Care (Signed)
Problem: Ineffective individual coping Goal: STG: Patient will remain free from self harm Outcome: Progressing Pt safe on the unit at this time     
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
## 2015-06-14 LAB — URINALYSIS COMPLETE WITH MICROSCOPIC (ARMC ONLY)
BILIRUBIN URINE: NEGATIVE
Glucose, UA: NEGATIVE mg/dL
Hgb urine dipstick: NEGATIVE
Ketones, ur: NEGATIVE mg/dL
Nitrite: NEGATIVE
PH: 5 (ref 5.0–8.0)
PROTEIN: NEGATIVE mg/dL
Specific Gravity, Urine: 1.02 (ref 1.005–1.030)

## 2015-06-14 LAB — URINE DRUG SCREEN, QUALITATIVE (ARMC ONLY)
Amphetamines, Ur Screen: NOT DETECTED
BARBITURATES, UR SCREEN: NOT DETECTED
Benzodiazepine, Ur Scrn: NOT DETECTED
CANNABINOID 50 NG, UR ~~LOC~~: NOT DETECTED
Cocaine Metabolite,Ur ~~LOC~~: NOT DETECTED
MDMA (ECSTASY) UR SCREEN: NOT DETECTED
Methadone Scn, Ur: NOT DETECTED
Opiate, Ur Screen: NOT DETECTED
PHENCYCLIDINE (PCP) UR S: NOT DETECTED
Tricyclic, Ur Screen: NOT DETECTED

## 2015-06-14 LAB — HEMOGLOBIN A1C: HEMOGLOBIN A1C: 5.3 % (ref 4.0–6.0)

## 2015-06-14 LAB — PROLACTIN: PROLACTIN: 7.6 ng/mL (ref 4.0–15.2)

## 2015-06-14 MED ORDER — FLUVOXAMINE MALEATE 50 MG PO TABS
50.0000 mg | ORAL_TABLET | Freq: Every day | ORAL | Status: DC
  Administered 2015-06-14: 50 mg via ORAL
  Filled 2015-06-14: qty 1

## 2015-06-14 MED ORDER — RISPERIDONE 1 MG PO TABS
1.0000 mg | ORAL_TABLET | Freq: Two times a day (BID) | ORAL | Status: DC
  Administered 2015-06-14 – 2015-06-20 (×13): 1 mg via ORAL
  Filled 2015-06-14 (×14): qty 1

## 2015-06-14 NOTE — Evaluation (Signed)
Physical Therapy Evaluation Patient Details Name: SULIMAN BRISCO MRN: [id number] DOB: 10-08-1965 Today's Date: 06/14/2015   History of Present Illness  Pt is a 50 y.o. male with PMH of depression, anxiety, COPD, HTN and conversion disorder.  Pt presented with auditory hallucinations and panic attacks.  Pt was admitted for suicidal ideation.      Clinical Impression  Prior to admission pt was independent with quad cane.  Pt reported that he has had, "too many falls to count," over the past 6 months.  Pt lives with wife and children.  Pt came to the door ambulating with RW and appeared to not display any gait deviations nor have an intentional tremor.  Pt displayed an intentional tremor during UE assessment.  Pt had decreased sensation on the R UE and LE.  Pt displayed decreased metria on R UE and decreased proprioception on the R LE.  Pt was CGA for sit to stand with RW.  Pt was CGA to min assist for ambulation with RW for 180 feet.  At beginning of ambulation pt appeared to have a slight limp with the R LE.  As ambulation distance increased, pt appeared to show decreased R dorsiflexion and L LE vaulting during R LE swing.  Pt's foot became stuck on the floor multiple times during the later portion of ambulation.  Pt took one standing rest break during ambulation approximately 75% the way through ambulation.  Pt reported that he was tired at the end of the session.  Due to aforementioned function and strength deficits, pt is in need of skilled physical therapy.  It is recommended that pt is discharged to home with familial support and home health PT when medically appropriate.     Follow Up Recommendations Home health PT    Equipment Recommendations  Rolling walker with 5" wheels    Recommendations for Other Services       Precautions / Restrictions Precautions Precautions: Fall Restrictions Weight Bearing Restrictions: No      Mobility  Bed Mobility               General bed  mobility comments: not assessed due to pt standing at beginning of the session and sitting EOB at end of the session   Transfers Overall transfer level: Needs assistance Equipment used: Rolling walker (2 wheeled) Transfers: Sit to/from Stand Sit to Stand: Min guard         General transfer comment: increased time   Ambulation/Gait Ambulation/Gait assistance: Min guard;Min assist Ambulation Distance (Feet): 180 Feet Assistive device: Rolling walker (2 wheeled) Gait Pattern/deviations: Decreased dorsiflexion - right;Decreased stance time - right;Step-to pattern (vaulting with L ) Gait velocity: decreased       Stairs            Wheelchair Mobility    Modified Rankin (Stroke Patients Only)       Balance Overall balance assessment: Needs assistance Sitting-balance support: Feet supported Sitting balance-Leahy Scale: Good     Standing balance support: Bilateral upper extremity supported (RW ) Standing balance-Leahy Scale: Fair                               Pertinent Vitals/Pain Pain Assessment: 0-10 Pain Score: 2  Pain Location: R LE  Pain Descriptors / Indicators: Aching;Constant;Discomfort  See flow sheet for vitals.     Home Living Family/patient expects to be discharged to:: Private residence Living Arrangements: Spouse/significant other;Children Available Help at Discharge:  Family   Home Access: Stairs to enter Entrance Stairs-Rails: None Entrance Stairs-Number of Steps: 3   Home Equipment: Cane - quad      Prior Function Level of Independence: Independent with assistive device(s) (quad cane )         Comments: Pt reported that he has had "too many falls to count."     Hand Dominance        Extremity/Trunk Assessment   Upper Extremity Assessment: RUE deficits/detail RUE Deficits / Details: decreased AROM strength    RUE Sensation: decreased light touch  Intentional tremor observed for R UE. Sensation:  L: WNL R:   C3-T1: Decreased Metria:  L: WNL; R: decreased speed compared to L for nose to finger touch.   Strength:   Shoulder flexors, shoulder abductors, elbow flexors, elbow extensors:  L: at least a 3/5; R: 3- for all   Pronator Drift:  L: WNL, R: increased pronator drift   Lower Extremity Assessment: RLE deficits/detail RLE Deficits / Details: decreased strength    Sensation:  L1-S1: L: WNL; R: decreased L5-S1: substantially decreased  Strength:   Hip flexors: L: 5/5; R: 3+/5 Quadriceps, hamstrings, dorsiflexors: L: 5/5; R: 3+/5  Proprioception:  Great toe: L: WNL, R: decreased    Cervical / Trunk Assessment: Normal  Communication   Communication: No difficulties  Cognition Arousal/Alertness: Awake/alert Behavior During Therapy: WFL for tasks assessed/performed Overall Cognitive Status: Within Functional Limits for tasks assessed                      General Comments  Pt was agreeable and session was modified due to fatigue.      Exercises        Assessment/Plan    PT Assessment Patient needs continued PT services  PT Diagnosis Difficulty walking   PT Problem List Decreased strength;Decreased range of motion;Decreased activity tolerance;Decreased balance;Decreased mobility  PT Treatment Interventions DME instruction;Gait training;Stair training;Functional mobility training;Therapeutic activities;Therapeutic exercise;Patient/family education   PT Goals (Current goals can be found in the Care Plan section) Acute Rehab PT Goals Patient Stated Goal: to go home  PT Goal Formulation: With patient Time For Goal Achievement: 06/28/15 Potential to Achieve Goals: Good    Frequency Min 2X/week   Barriers to discharge        Co-evaluation               End of Session Equipment Utilized During Treatment: Gait belt Activity Tolerance: Patient limited by fatigue Patient left: in bed;with call bell/phone within reach Nurse Communication: Mobility status          Time: [id number] PT Time Calculation (min) (ACUTE ONLY): 23 min   Charges:         PT G Codes:       Mittie Bodo, SPT  Mittie Bodo 06/14/2015, 1:04 PM

## 2015-06-14 NOTE — Plan of Care (Signed)
Problem: Alteration in mood Goal: LTG-Pt's behavior demonstrates decreased signs of depression (Patient's behavior demonstrates decreased signs of depression to the point the patient is safe to return home and continue treatment in an outpatient setting)  Outcome: Progressing Mood is brighter today.

## 2015-06-14 NOTE — Progress Notes (Signed)
[facility] MD Progress Note  06/14/2015 2:06 PM Arthur Howe  MRN:  [id number]  Subjective:  [name] still feels very depressed and suicidal. Auditory command hallucinations telling him to kill himself continue. He feels a little more optimistic about the future once his medications were readjusted to what he thinks is right. He worked with physical therapy today and he indicated that the patient's needs home PT to strengthen his gait. PT consult is greatly appreciated. The patient has been moving around the unit with with a walker. He participates in programming. Sleep and appetite are fair. [name] participating in treatment team meeting when he was able to ask questions, express his concerns, and participate in discharge planning.  Principal Problem: Major depressive disorder, recurrent severe without psychotic features ([place]) Diagnosis:   Patient Active Problem List   Diagnosis Date Noted  . Diverticulitis [K57.92] 08/17/2014  . Acid reflux [K21.9] 10/15/2013  . Headache, migraine [G43.909] 10/15/2013  . 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 Essential hypertension [I10] 10/05/2013  . Syncope [R55] 10/05/2013  . Anxiety [F41.9] 09/14/2013  . Apnea, sleep [G47.30] 09/14/2013  . Major depressive disorder, recurrent severe without psychotic features ([place]) [F33.2] 09/14/2013  . LBP (low back pain) [M54.5] 06/12/2013  . Dupuytren's contracture of foot [M72.2] 06/12/2013  . HLD (hyperlipidemia) [E78.5] 06/12/2013   Total Time spent with patient: 20 minutes  Past Psychiatric History: Depression and anxiety.  Past Medical History:  Past Medical History  Diagnosis Date  . Syncope and collapse   . Depression   . Hypertension   . Hyperlipidemia   . Plantar fascial fibromatosis   . Anxiety   . Sleep apnea   . Migraine   . Diverticulitis   . Conversion disorder     Past Surgical History  Procedure Laterality Date  . Gallbladder surgery    . Urethra surgery     . Colon surgery     Family History:  Family History  Problem Relation Age of Onset  . Arrhythmia Maternal Grandmother     heart murmur   . Hypertension Maternal Grandmother   . Hypertension Mother   . Anxiety disorder Mother   . Heart failure Father   . Bipolar disorder Paternal Uncle   . Schizophrenia Paternal Uncle    Family Psychiatric  History: Depression and Social History:  History  Alcohol Use No     History  Drug Use No    Social History   Social History  . Marital Status: Married    Spouse Name: N/A  . Number of Children: N/A  . Years of Education: N/A   Social History Main Topics  . Smoking status: Never Smoker   . Smokeless tobacco: Never Used  . Alcohol Use: No  . Drug Use: No  . Sexual Activity: Yes   Other Topics Concern  . None   Social History Narrative   Additional Social History:                         Sleep: Fair  Appetite:  Fair  Current Medications: Current Facility-Administered Medications  Medication Dose Route Frequency Provider Last Rate Last Dose  . acetaminophen (TYLENOL) tablet 650 mg  650 mg Oral Q6H PRN Jolanta B Pucilowska, MD      . albuterol (PROVENTIL HFA;VENTOLIN HFA) 108 (90 Base) MCG/ACT inhaler 2 puff  2 puff Inhalation Q6H PRN Clovis Fredrickson, MD      . alum & mag  hydroxide-simeth (MAALOX/MYLANTA) 200-200-20 MG/5ML suspension 30 mL  30 mL Oral Q4H PRN Jolanta B Pucilowska, MD      . buPROPion (WELLBUTRIN XL) 24 hr tablet 300 mg  300 mg Oral Daily Jolanta B Pucilowska, MD   300 mg at 06/14/15 0914  . clonazePAM (KLONOPIN) tablet 1 mg  1 mg Oral BID PRN Clovis Fredrickson, MD   1 mg at 06/13/15 2128  . FLUoxetine (PROZAC) capsule 60 mg  60 mg Oral Daily Clovis Fredrickson, MD   60 mg at 06/14/15 0914  . fluticasone (FLONASE) 50 MCG/ACT nasal spray 2 spray  2 spray Each Nare Daily Clovis Fredrickson, MD   2 spray at 06/13/15 2129  . loratadine (CLARITIN) tablet 10 mg  10 mg Oral Daily Clovis Fredrickson, MD   10 mg at 06/14/15 0915  . losartan (COZAAR) tablet 100 mg  100 mg Oral Daily Jolanta B Pucilowska, MD   100 mg at 06/14/15 0915  . magnesium hydroxide (MILK OF MAGNESIA) suspension 30 mL  30 mL Oral Daily PRN Jolanta B Pucilowska, MD      . metoprolol succinate (TOPROL-XL) 24 hr tablet 25 mg  25 mg Oral Daily Jolanta B Pucilowska, MD   25 mg at 06/14/15 0915  . mometasone-formoterol (DULERA) 200-5 MCG/ACT inhaler 2 puff  2 puff Inhalation BID Clovis Fredrickson, MD   2 puff at 06/14/15 0921  . pantoprazole (PROTONIX) EC tablet 40 mg  40 mg Oral Daily Jolanta B Pucilowska, MD   40 mg at 06/14/15 0915  . SUMAtriptan (IMITREX) tablet 50 mg  50 mg Oral Q2H PRN Jolanta B Pucilowska, MD      . topiramate (TOPAMAX) tablet 100 mg  100 mg Oral QHS Jolanta B Pucilowska, MD   100 mg at 06/13/15 2128  . traZODone (DESYREL) tablet 100 mg  100 mg Oral QHS Clovis Fredrickson, MD   100 mg at 06/13/15 2128    Lab Results:  Results for orders placed or performed during the hospital encounter of 06/13/15 (from the past 48 hour(s))  Hemoglobin A1c     Status: None   Collection Time: 06/13/15  3:39 PM  Result Value Ref Range   Hgb A1c MFr Bld 5.3 4.0 - 6.0 %  Prolactin     Status: None   Collection Time: 06/13/15  3:39 PM  Result Value Ref Range   Prolactin 7.6 4.0 - 15.2 ng/mL    Comment: (NOTE) Performed At: [facility] [place], [place] [id number] Lindon Romp MD [id number]   Lipid panel     Status: Abnormal   Collection Time: 06/13/15  3:40 PM  Result Value Ref Range   Cholesterol 190 0 - 200 mg/dL   Triglycerides 231 (H) <150 mg/dL   HDL 43 >40 mg/dL   Total CHOL/HDL Ratio 4.4 RATIO   VLDL 46 (H) 0 - 40 mg/dL   LDL Cholesterol 101 (H) 0 - 99 mg/dL    Comment:        Total Cholesterol/HDL:CHD Risk Coronary Heart Disease Risk Table                     Men   Women  1/2 Average Risk   3.4   3.3  Average Risk       5.0   4.4  2 X Average Risk    9.6   7.1  3 X Average Risk  23.4   11.0  Use the calculated Patient Ratio above and the CHD Risk Table to determine the patient's CHD Risk.        ATP III CLASSIFICATION (LDL):  <100     mg/dL   Optimal  100-129  mg/dL   Near or Above                    Optimal  130-159  mg/dL   Borderline  160-189  mg/dL   High  >190     mg/dL   Very High   TSH     Status: None   Collection Time: 06/13/15  3:40 PM  Result Value Ref Range   TSH 0.887 0.350 - 4.500 uIU/mL  Urine Drug Screen, Qualitative ([facility] only)     Status: None   Collection Time: 06/14/15  7:05 AM  Result Value Ref Range   Tricyclic, Ur Screen NONE DETECTED NONE DETECTED   Amphetamines, Ur Screen NONE DETECTED NONE DETECTED   MDMA (Ecstasy)Ur Screen NONE DETECTED NONE DETECTED   Cocaine Metabolite,Ur [place] NONE DETECTED NONE DETECTED   Opiate, Ur Screen NONE DETECTED NONE DETECTED   Phencyclidine (PCP) Ur S NONE DETECTED NONE DETECTED   Cannabinoid 50 Ng, Ur [place] NONE DETECTED NONE DETECTED   Barbiturates, Ur Screen NONE DETECTED NONE DETECTED   Benzodiazepine, Ur Scrn NONE DETECTED NONE DETECTED   Methadone Scn, Ur NONE DETECTED NONE DETECTED    Comment: (NOTE) [id number]  Tricyclics, urine               Cutoff 1000 ng/mL 200  Amphetamines, urine             Cutoff 1000 ng/mL 300  MDMA (Ecstasy), urine           Cutoff 500 ng/mL 400  Cocaine Metabolite, urine       Cutoff 300 ng/mL 500  Opiate, urine                   Cutoff 300 ng/mL 600  Phencyclidine (PCP), urine      Cutoff 25 ng/mL 700  Cannabinoid, urine              Cutoff 50 ng/mL 800  Barbiturates, urine             Cutoff 200 ng/mL 900  Benzodiazepine, urine           Cutoff 200 ng/mL 1000 Methadone, urine                Cutoff 300 ng/mL 1100 1200 The urine drug screen provides only a preliminary, unconfirmed 1300 analytical test result and should not be used for non-medical 1400 purposes. Clinical consideration and professional judgment should 1500 be applied  to any positive drug screen result due to possible 1600 interfering substances. A more specific alternate chemical method 1700 must be used in order to obtain a confirmed analytical result.  1800 Gas chromato graphy / mass spectrometry (GC/MS) is the preferred 1900 confirmatory method.   Urinalysis complete, with microscopic ([facility] only)     Status: Abnormal   Collection Time: 06/14/15  7:05 AM  Result Value Ref Range   Color, Urine YELLOW (A) YELLOW   APPearance HAZY (A) CLEAR   Glucose, UA NEGATIVE NEGATIVE mg/dL   Bilirubin Urine NEGATIVE NEGATIVE   Ketones, ur NEGATIVE NEGATIVE mg/dL   Specific Gravity, Urine 1.020 1.005 - 1.030   Hgb urine dipstick NEGATIVE NEGATIVE   pH 5.0 5.0 - 8.0   Protein, ur NEGATIVE  NEGATIVE mg/dL   Nitrite NEGATIVE NEGATIVE   Leukocytes, UA 2+ (A) NEGATIVE   RBC / HPF 6-30 0 - 5 RBC/hpf   WBC, UA TOO NUMEROUS TO COUNT 0 - 5 WBC/hpf   Bacteria, UA RARE (A) NONE SEEN   Squamous Epithelial / LPF 0-5 (A) NONE SEEN   Mucous PRESENT    Ca Oxalate Crys, UA PRESENT     Blood Alcohol level:  No results found for: [facility]  Physical Findings: AIMS:  , ,  ,  , Dental Status Current problems with teeth and/or dentures?: No Does patient usually wear dentures?: No  CIWA:    COWS:     Musculoskeletal: Strength & Muscle Tone: decreased Gait & Station: unsteady Patient leans: N/A  Psychiatric Specialty Exam: Review of Systems  Musculoskeletal: Positive for falls.  Neurological: Positive for weakness.  Psychiatric/Behavioral: Positive for depression, suicidal ideas and hallucinations.  All other systems reviewed and are negative.   Blood pressure 121/76, pulse 74, temperature 97.7 F (36.5 C), temperature source Oral, resp. rate 18, height 6' (1.829 m), weight 59.421 kg (131 lb), SpO2 98 %.Body mass index is 17.76 kg/(m^2).  General Appearance: Casual  Eye Contact::  Good  Speech:  Clear and Coherent  Volume:  Normal  Mood:  Depressed  Affect:   Appropriate  Thought Process:  Goal Directed  Orientation:  Full (Time, Place, and Person)  Thought Content:  Hallucinations: Auditory Command:  Telling him to kill himself.  Suicidal Thoughts:  Yes.  with intent/plan  Homicidal Thoughts:  No  Memory:  Immediate;   Fair Recent;   Fair Remote;   Fair  Judgement:  Fair  Insight:  Fair  Psychomotor Activity:  Decreased  Concentration:  Fair  Recall:  [employer] of Knowledge:Fair  Language: Fair  Akathisia:  No  Handed:  Right  AIMS (if indicated):     Assets:  Communication Skills Desire for Improvement Financial Resources/Insurance Housing Resilience Social Support  ADL's:  Intact  Cognition: WNL  Sleep:  Number of Hours: 6.15   Treatment Plan Summary: Daily contact with patient to assess and evaluate symptoms and progress in treatment and Medication management   Mr. Meller is a 50 year old male with a history of depression, anxiety, and conversion disorder admitting for worsening of his symptoms and new onset auditory command hallucinations telling him self in the context of medication readjustment and severe social stressors.  1. Suicidal ideation. The patient is able to contract for safety in the hospital.  2. Mood. He has been maintained on a combination of Wellbutrin and Prozac. His Prozac has been recently lowered the patient feels that this was detrimental. Will increase Prozac to 60 mg again. We will consider switching to Luvox. We will add Risperdal for psychosis and augmentation.  3. Anxiety. He was maintained on Klonopin 1 mg twice daily. When this was lowered the patient started experiencing severe anxiety and worsening of depression.  4. COPD. He is on albuterol and spiriva.  5. Allergies. He is on Advertising account planner.  6. Migraine headache. He is on Topamax for headache prevention and Imitrex. We increased Topamax to 100 mg.   7. Hypertension. He is on Toprol-XL and Cozaar.  8. Insomnia. He is on  trazodone.  9. Sleep apnea. His wife will bring his CPAP machine.  10. Fall risk. The patient experiences weakness in lower extremities and difficulty speaking. He was ruled out for stroke by Dr. Melrose Nakayama and Dr. Allen Norris. He was told that  he has conversion disorder. At home he uses a cane and a wheelchair occasionally. PT consult is greatly appreciated.    11. Disposition. He will be discharged to home with family. He will need a follow-up appointment with a new psychiatrist  Orson Slick, MD 06/14/2015, 2:06 PM

## 2015-06-14 NOTE — Progress Notes (Signed)
Patient verbalized that he feels little better today.Verbalized passive suicidal thoughts & hearing voices but it is not clear.Participated in groups.Appropriate with staff & peers.Compliant with medications.Walks with walker without any problem.Patient came to staff stating that he is going to have a panic attack.Encouraged patient for positive coping skills & relaxation technique.Patient stated that he is calm down.Appetite & energy level good.

## 2015-06-15 MED ORDER — FLUVOXAMINE MALEATE 50 MG PO TABS
100.0000 mg | ORAL_TABLET | Freq: Every day | ORAL | Status: DC
  Administered 2015-06-15 – 2015-06-16 (×2): 100 mg via ORAL
  Filled 2015-06-15 (×2): qty 2

## 2015-06-15 MED ORDER — PRAZOSIN HCL 1 MG PO CAPS
2.0000 mg | ORAL_CAPSULE | Freq: Every day | ORAL | Status: DC
  Administered 2015-06-15 – 2015-06-19 (×5): 2 mg via ORAL
  Filled 2015-06-15 (×5): qty 2

## 2015-06-15 MED ORDER — FLUOXETINE HCL 20 MG PO CAPS
40.0000 mg | ORAL_CAPSULE | Freq: Every day | ORAL | Status: DC
  Administered 2015-06-16: 40 mg via ORAL
  Filled 2015-06-15: qty 2

## 2015-06-15 NOTE — Progress Notes (Signed)
D: Observed pt in day room visiting with family . Patient alert and oriented x4. Patient endorses passive SI but contracts for safety. Pt endorses auditory hallucinations "sounds like a crowded room, can't understand the voices." Pt indicated the voices are getting better than when he came in. Pt denies HI/VH. Pt affect is depressed. Pt rated anxiety 7/10 and depression 3/10. Pt ambulating with rolling walker. Pt mentioned his mood improving after his family visited. Pt had no other complaints.  A: Offered active listening and support. Provided therapeutic communication. Administered scheduled medications. Reinforced use of call light for assistance when transferring.  R: Pt acknowledged use of call light. Pt pleasant and cooperative. Pt medication compliant. Will continue Q15 min. checks. Safety maintained.

## 2015-06-15 NOTE — B[facility] Group Notes (Signed)
[facility] Group Notes:  (Nursing/MHT/Case Management/Adjunct)  Date:  06/15/2015  Time:  3:52 PM  Type of Therapy:  Psychoeducational Skills  Participation Level:  Did Not Attend  Charise Killian 06/15/2015, 3:52 PM

## 2015-06-15 NOTE — BHH Group Notes (Signed)
[facility] Group Notes:  (Nursing/MHT/Case Management/Adjunct)  Date:  06/15/2015  Time:  9:23 PM  Type of Therapy:  Group Therapy  Participation Level:  Active  Participation Quality:  Appropriate  Affect:  Appropriate  Cognitive:  Alert and Appropriate  Insight:  Appropriate  Engagement in Group:  Engaged  Modes of Intervention:  Discussion  Summary of Progress/Problems:  Arthur Howe 06/15/2015, 9:23 PM

## 2015-06-15 NOTE — BHH Group Notes (Signed)
[facility] LCSW Group Therapy  06/15/2015 3:55 PM  Type of Therapy:  Group Therapy  Participation Level:  Active  Participation Quality:  Appropriate  Affect:  Flat  Cognitive:  Appropriate  Insight:  Limited  Engagement in Therapy:  Improving  Modes of Intervention:  Activity, Discussion, Problem-solving, Socialization and Support  Summary of Progress/Problems: Pt attended and participated in morning activity where group discussed incongruence of the way we allow ourselves to be perceived versus the real [place] on the inside.  Group completed Inside/outside activity decorating outside of paper bag with other's perceptions or what we allow to show and filled the inside with images and words that describe the true inside.  Group discussed the discomfort these incongruent parts can cause and strategies to begin to make them match. August Saucer, MSW, LCSW 06/15/2015, 3:55 PM

## 2015-06-15 NOTE — B[facility] Group Notes (Signed)
[facility] LCSW Group Therapy  06/15/2015 3:56 PM  Type of Therapy:  Group Therapy  Participation Level:  Active  Participation Quality:  Appropriate  Affect:  Appropriate  Cognitive:  Appropriate  Insight:  Engaged  Engagement in Therapy:  Improving  Modes of Intervention:  Activity, Discussion, Problem-solving, Socialization and Support  Summary of Progress/Problems:Pt attended and participated in group discussion around emotion regulation/recovery/and review of the inside/outside bag activity that patients worked on in previous morning group.  Pt shared appropriately about his bag and described inside/outside activity in an appropriate way that could be understood.  Began to talk about strategies to make the inside and outside match and how this would bring joy and peace both can bring.  August Saucer, MSW, LCSW 06/15/2015, 3:56 PM

## 2015-06-15 NOTE — Tx Team (Signed)
Interdisciplinary Treatment Plan Update (Adult)  Date:  06/15/2015 Time Reviewed:  3:48 PM  Progress in Treatment: Attending groups: No. Participating in groups:  No. Taking medication as prescribed:  Yes. Tolerating medication:  Yes. Family/Significant othe contact made:  No, will contact:  if patietn provides consent Patient understands diagnosis:  Yes. Discussing patient identified problems/goals with staff:  Yes. Medical problems stabilized or resolved:  Yes. Denies suicidal/homicidal ideation: Yes. Issues/concerns per patient self-inventory:  Yes. Other:  New problem(s) identified: No, Describe:  none reported  Discharge Plan or Barriers: Patient will stabilize and discharge home with outpatient follow up scheduled.  Reason for Continuation of Hospitalization: Depression Medication stabilization Suicidal ideation  Comments:  Estimated length of stay: 3-5 days  New goal(s):  Review of initial/current patient goals per problem list:   1.  Goal(s): Participate in aftercare plan   Met:  No  Target date: by discharge  As evidenced by: patient will participate in aftercare plan AEB aftercare provider and housing plan identified at discharge 06/14/15: Patient does not have a provider and will need one identified that takes his insurance by discharge.    2.  Goal (s): Decrease depression   Met:  No  Target date:  As evidenced by: patient demonstrates decreased symptoms of depression and reports a Depression rating of 3 or less 06/14/15: Patient isolates in bed and not much participation in milieu appears depressed.     Attendees: Patient:  Arthur Howe 4/12/20173:48 PM  Physician:  Orson Slick, MD 4/12/20173:48 PM  Nursing:   Polly Cobia, RN 4/12/20173:48 PM  Other:  Carmell Austria, LCSW 4/12/20173:48 PM  Other:  Garald Braver, Ph D. 4/12/20173:48 PM  Other:   4/12/20173:48 PM  Other:  4/12/20173:48 PM  Other:  4/12/20173:48 PM  Other:  4/12/20173:48  PM  Other:  4/12/20173:48 PM  Other:  4/12/20173:48 PM  Other:  4/12/20173:48 PM  Other:   4/12/20173:48 PM   Scribe for Treatment Team:   Keene Breath, MSW, LCSW  06/15/2015, 3:48 PM

## 2015-06-15 NOTE — Progress Notes (Addendum)
Patient with sad affect, cooperative behavior with meals, meds and plan of care. No SI/HI/AVH at this time. Remains on fall protocol with safety maintained. Encouraged to attend therapy groups to learn and initiate coping skills. Safety maintained. Ambulates with walker with slow and steady gait. Safety maintained.

## 2015-06-15 NOTE — Progress Notes (Addendum)
[facility] MD Progress Note  06/15/2015 2:01 PM Arthur Howe  MRN:  [id number]  Subjective:  Arthur Howe still feels very depressed, suicidal, hallucinating. The voices are telling him to kill himself. He felt a little somnolent this morning after medication adjustments. His goal for today's to stay out of his room and try to engage with peers and staff.  Principal Problem: Major depressive disorder, recurrent severe without psychotic features ([place]) Diagnosis:   Patient Active Problem List   Diagnosis Date Noted  . Diverticulitis [K57.92] 08/17/2014  . Acid reflux [K21.9] 10/15/2013  . Headache, migraine [G43.909] 10/15/2013  . 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 Essential hypertension [I10] 10/05/2013  . Syncope [R55] 10/05/2013  . Anxiety [F41.9] 09/14/2013  . Apnea, sleep [G47.30] 09/14/2013  . Major depressive disorder, recurrent severe without psychotic features ([place]) [F33.2] 09/14/2013  . LBP (low back pain) [M54.5] 06/12/2013  . Dupuytren's contracture of foot [M72.2] 06/12/2013  . HLD (hyperlipidemia) [E78.5] 06/12/2013   Total Time spent with patient: 20 minutes  Past Psychiatric History: Depression, anxiety.  Past Medical History:  Past Medical History  Diagnosis Date  . Syncope and collapse   . Depression   . Hypertension   . Hyperlipidemia   . Plantar fascial fibromatosis   . Anxiety   . Sleep apnea   . Migraine   . Diverticulitis   . Conversion disorder     Past Surgical History  Procedure Laterality Date  . Gallbladder surgery    . Urethra surgery    . Colon surgery     Family History:  Family History  Problem Relation Age of Onset  . Arrhythmia Maternal Grandmother     heart murmur   . Hypertension Maternal Grandmother   . Hypertension Mother   . Anxiety disorder Mother   . Heart failure Father   . Bipolar disorder Paternal Uncle   . Schizophrenia Paternal Uncle    Family Psychiatric  History: See H&P. Social History:   History  Alcohol Use No     History  Drug Use No    Social History   Social History  . Marital Status: Married    Spouse Name: N/A  . Number of Children: N/A  . Years of Education: N/A   Social History Main Topics  . Smoking status: Never Smoker   . Smokeless tobacco: Never Used  . Alcohol Use: No  . Drug Use: No  . Sexual Activity: Yes   Other Topics Concern  . None   Social History Narrative   Additional Social History:                         Sleep: Fair  Appetite:  Fair  Current Medications: Current Facility-Administered Medications  Medication Dose Route Frequency Provider Last Rate Last Dose  . acetaminophen (TYLENOL) tablet 650 mg  650 mg Oral Q6H PRN Jolanta B Pucilowska, MD      . albuterol (PROVENTIL HFA;VENTOLIN HFA) 108 (90 Base) MCG/ACT inhaler 2 puff  2 puff Inhalation Q6H PRN Jolanta B Pucilowska, MD      . alum & mag hydroxide-simeth (MAALOX/MYLANTA) 200-200-20 MG/5ML suspension 30 mL  30 mL Oral Q4H PRN Jolanta B Pucilowska, MD      . buPROPion (WELLBUTRIN XL) 24 hr tablet 300 mg  300 mg Oral Daily Jolanta B Pucilowska, MD   300 mg at 06/15/15 0907  . clonazePAM (KLONOPIN) tablet 1 mg  1 mg Oral BID PRN Jolanta  Vevelyn Francois, MD   1 mg at 06/13/15 2128  . [START ON 06/16/2015] FLUoxetine (PROZAC) capsule 40 mg  40 mg Oral Daily Jolanta B Pucilowska, MD      . fluticasone (FLONASE) 50 MCG/ACT nasal spray 2 spray  2 spray Each Nare Daily Clovis Fredrickson, MD   2 spray at 06/14/15 2146  . fluvoxaMINE (LUVOX) tablet 100 mg  100 mg Oral QHS Jolanta B Pucilowska, MD      . loratadine (CLARITIN) tablet 10 mg  10 mg Oral Daily Jolanta B Pucilowska, MD   10 mg at 06/15/15 0908  . losartan (COZAAR) tablet 100 mg  100 mg Oral Daily Jolanta B Pucilowska, MD   100 mg at 06/15/15 0908  . magnesium hydroxide (MILK OF MAGNESIA) suspension 30 mL  30 mL Oral Daily PRN Jolanta B Pucilowska, MD      . metoprolol succinate (TOPROL-XL) 24 hr tablet 25 mg  25 mg  Oral Daily Jolanta B Pucilowska, MD   25 mg at 06/15/15 0908  . mometasone-formoterol (DULERA) 200-5 MCG/ACT inhaler 2 puff  2 puff Inhalation BID Clovis Fredrickson, MD   2 puff at 06/15/15 0909  . pantoprazole (PROTONIX) EC tablet 40 mg  40 mg Oral Daily Jolanta B Pucilowska, MD   40 mg at 06/15/15 0907  . prazosin (MINIPRESS) capsule 2 mg  2 mg Oral QHS Jolanta B Pucilowska, MD      . risperiDONE (RISPERDAL) tablet 1 mg  1 mg Oral BID Clovis Fredrickson, MD   1 mg at 06/15/15 0910  . SUMAtriptan (IMITREX) tablet 50 mg  50 mg Oral Q2H PRN Jolanta B Pucilowska, MD      . topiramate (TOPAMAX) tablet 100 mg  100 mg Oral QHS Clovis Fredrickson, MD   100 mg at 06/14/15 2145  . traZODone (DESYREL) tablet 100 mg  100 mg Oral QHS Clovis Fredrickson, MD   100 mg at 06/14/15 2145    Lab Results:  Results for orders placed or performed during the [facility] encounter of 06/13/15 (from the past 48 hour(s))  Hemoglobin A1c     Status: None   Collection Time: 06/13/15  3:39 PM  Result Value Ref Range   Hgb A1c MFr Bld 5.3 4.0 - 6.0 %  Prolactin     Status: None   Collection Time: 06/13/15  3:39 PM  Result Value Ref Range   Prolactin 7.6 4.0 - 15.2 ng/mL    Comment: (NOTE) Performed At: [facility] [place], [place] [id number] Lindon Romp MD [id number]   Lipid panel     Status: Abnormal   Collection Time: 06/13/15  3:40 PM  Result Value Ref Range   Cholesterol 190 0 - 200 mg/dL   Triglycerides 231 (H) <150 mg/dL   HDL 43 >40 mg/dL   Total CHOL/HDL Ratio 4.4 RATIO   VLDL 46 (H) 0 - 40 mg/dL   LDL Cholesterol 101 (H) 0 - 99 mg/dL    Comment:        Total Cholesterol/HDL:CHD Risk Coronary Heart Disease Risk Table                     Men   Women  1/2 Average Risk   3.4   3.3  Average Risk       5.0   4.4  2 X Average Risk   9.6   7.1  3 X Average Risk  23.4   11.0  Use the calculated Patient Ratio above and the CHD Risk Table to determine the  patient's CHD Risk.        ATP III CLASSIFICATION (LDL):  <100     mg/dL   Optimal  100-129  mg/dL   Near or Above                    Optimal  130-159  mg/dL   Borderline  160-189  mg/dL   High  >190     mg/dL   Very High   TSH     Status: None   Collection Time: 06/13/15  3:40 PM  Result Value Ref Range   TSH 0.887 0.350 - 4.500 uIU/mL  Urine Drug Screen, Qualitative ([facility] only)     Status: None   Collection Time: 06/14/15  7:05 AM  Result Value Ref Range   Tricyclic, Ur Screen NONE DETECTED NONE DETECTED   Amphetamines, Ur Screen NONE DETECTED NONE DETECTED   MDMA (Ecstasy)Ur Screen NONE DETECTED NONE DETECTED   Cocaine Metabolite,Ur [place] NONE DETECTED NONE DETECTED   Opiate, Ur Screen NONE DETECTED NONE DETECTED   Phencyclidine (PCP) Ur S NONE DETECTED NONE DETECTED   Cannabinoid 50 Ng, Ur [place] NONE DETECTED NONE DETECTED   Barbiturates, Ur Screen NONE DETECTED NONE DETECTED   Benzodiazepine, Ur Scrn NONE DETECTED NONE DETECTED   Methadone Scn, Ur NONE DETECTED NONE DETECTED    Comment: (NOTE) [id number]  Tricyclics, urine               Cutoff 1000 ng/mL 200  Amphetamines, urine             Cutoff 1000 ng/mL 300  MDMA (Ecstasy), urine           Cutoff 500 ng/mL 400  Cocaine Metabolite, urine       Cutoff 300 ng/mL 500  Opiate, urine                   Cutoff 300 ng/mL 600  Phencyclidine (PCP), urine      Cutoff 25 ng/mL 700  Cannabinoid, urine              Cutoff 50 ng/mL 800  Barbiturates, urine             Cutoff 200 ng/mL 900  Benzodiazepine, urine           Cutoff 200 ng/mL 1000 Methadone, urine                Cutoff 300 ng/mL 1100 1200 The urine drug screen provides only a preliminary, unconfirmed 1300 analytical test result and should not be used for non-medical 1400 purposes. Clinical consideration and professional judgment should 1500 be applied to any positive drug screen result due to possible 1600 interfering substances. A more specific alternate chemical method 1700  must be used in order to obtain a confirmed analytical result.  1800 Gas chromato graphy / mass spectrometry (GC/MS) is the preferred 1900 confirmatory method.   Urinalysis complete, with microscopic ([facility] only)     Status: Abnormal   Collection Time: 06/14/15  7:05 AM  Result Value Ref Range   Color, Urine YELLOW (A) YELLOW   APPearance HAZY (A) CLEAR   Glucose, UA NEGATIVE NEGATIVE mg/dL   Bilirubin Urine NEGATIVE NEGATIVE   Ketones, ur NEGATIVE NEGATIVE mg/dL   Specific Gravity, Urine 1.020 1.005 - 1.030   Hgb urine dipstick NEGATIVE NEGATIVE   pH 5.0 5.0 - 8.0   Protein, ur  NEGATIVE NEGATIVE mg/dL   Nitrite NEGATIVE NEGATIVE   Leukocytes, UA 2+ (A) NEGATIVE   RBC / HPF 6-30 0 - 5 RBC/hpf   WBC, UA TOO NUMEROUS TO COUNT 0 - 5 WBC/hpf   Bacteria, UA RARE (A) NONE SEEN   Squamous Epithelial / LPF 0-5 (A) NONE SEEN   Mucous PRESENT    Ca Oxalate Crys, UA PRESENT   Urine culture     Status: None (Preliminary result)   Collection Time: 06/14/15  7:05 AM  Result Value Ref Range   Specimen Description URINE, CLEAN CATCH    Special Requests NONE    Culture HOLDING FOR POSSIBLE PATHOGEN    Report Status PENDING     Blood Alcohol level:  No results found for: [place]  Physical Findings: AIMS:  , ,  ,  , Dental Status Current problems with teeth and/or dentures?: No Does patient usually wear dentures?: No  CIWA:    COWS:     Musculoskeletal: Strength & Muscle Tone: within normal limits Gait & Station: unsteady Patient leans: N/A  Psychiatric Specialty Exam: Review of Systems  Musculoskeletal: Positive for falls.  Neurological: Positive for weakness.  Psychiatric/Behavioral: Positive for depression, suicidal ideas and hallucinations.  All other systems reviewed and are negative.   Blood pressure 122/75, pulse 70, temperature 97.9 F (36.6 C), temperature source Oral, resp. rate 18, height 6' (1.829 m), weight 59.421 kg (131 lb), SpO2 98 %.Body mass index is 17.76 kg/(m^2).   General Appearance: Casual  Eye Contact::  Good  Speech:  Clear and Coherent  Volume:  Normal  Mood:  Anxious, Depressed, Hopeless and Worthless  Affect:  Blunt  Thought Process:  Goal Directed  Orientation:  Full (Time, Place, and Person)  Thought Content:  Hallucinations: Auditory Command:  Commanding him to kill himself.  Suicidal Thoughts:  Yes.  with intent/plan  Homicidal Thoughts:  No  Memory:  Immediate;   Fair Recent;   Fair Remote;   Fair  Judgement:  Fair  Insight:  Fair  Psychomotor Activity:  Normal  Concentration:  Fair  Recall:  [employer] [place]  Language: Fair  Akathisia:  No  Handed:  Right  AIMS (if indicated):     Assets:  Communication Skills Desire for Improvement Financial Resources/Insurance Housing Intimacy Resilience Social Support Transportation  ADL's:  Intact  Cognition: WNL  Sleep:  Number of Hours: 6.75   Treatment Plan Summary: Daily contact with patient to assess and evaluate symptoms and progress in treatment and Medication management   Arthur Howe is a 50 year old male with a history of depression, anxiety, and conversion disorder admitting for worsening of his symptoms and new onset auditory command hallucinations telling him self in the context of medication readjustment and severe social stressors.  1. Suicidal ideation. The patient is able to contract for safety in the [facility].  2. Mood. He has been maintained on a combination of Wellbutrin and Prozac. His Prozac has been recently lowered the patient feels that this was detrimental. We increase Luvox to 100 mg tonight and continue Risperdal for psychosis and augmentation.  3. Anxiety. He was maintained on Klonopin 1 mg twice daily. When this was lowered the patient started experiencing severe anxiety and worsening of depression. We will start Minipress tonight for nightmares.  4. COPD. He is on albuterol and spiriva.  5. Allergies. He is on Advertising account planner.  6.  Migraine headache. He is on Topamax for headache prevention and Imitrex. We increased Topamax to  100 mg.   7. Hypertension. He is on Toprol-XL and Cozaar.  8. Insomnia. He is on trazodone.  9. Sleep apnea. His wife will bring his CPAP machine.  10. Fall risk. The patient experiences weakness in lower extremities and difficulty speaking. He was ruled out for stroke by Dr. Melrose Nakayama and Dr. Allen Norris. He was told that he has conversion disorder. At home he uses a cane and a wheelchair occasionally. PT consult is greatly appreciated.   11. Metabolic syndromes screening. Triglycerides are elevated at 231, hemoglobin A1c, TSH, and prolactin are normal.   12. UTI. Urine culture pending.   13. Disposition. He will be discharged to home with family. He will need a follow-up appointment with a new psychiatrist   Orson Slick, MD 06/15/2015, 2:01 PM

## 2015-06-15 NOTE — Plan of Care (Signed)
Problem: Diagnosis: Increased Risk For Suicide Attempt Goal: STG-Patient Will Comply With Medication Regime Outcome: Progressing Pt compliant with medication regimen     

## 2015-06-16 LAB — BASIC METABOLIC PANEL
ANION GAP: 6 (ref 5–15)
BUN: 17 mg/dL (ref 6–20)
CALCIUM: 9.1 mg/dL (ref 8.9–10.3)
CO2: 24 mmol/L (ref 22–32)
Chloride: 105 mmol/L (ref 101–111)
Creatinine, Ser: 1.14 mg/dL (ref 0.61–1.24)
GFR calc Af Amer: >60 mL/min (ref >60)
Glucose, Bld: 152 mg/dL — ABNORMAL HIGH (ref 65–99)
POTASSIUM: 3.5 mmol/L (ref 3.5–5.1)
SODIUM: 135 mmol/L (ref 135–145)

## 2015-06-16 LAB — URINE CULTURE: Culture: >=100000 — AB

## 2015-06-16 MED ORDER — PANTOPRAZOLE SODIUM 40 MG PO TBEC
40.0000 mg | DELAYED_RELEASE_TABLET | Freq: Two times a day (BID) | ORAL | Status: DC
  Administered 2015-06-16 – 2015-06-20 (×8): 40 mg via ORAL
  Filled 2015-06-16 (×9): qty 1

## 2015-06-16 MED ORDER — NITROFURANTOIN MONOHYD MACRO 100 MG PO CAPS
100.0000 mg | ORAL_CAPSULE | Freq: Two times a day (BID) | ORAL | Status: DC
  Administered 2015-06-16 – 2015-06-20 (×9): 100 mg via ORAL
  Filled 2015-06-16 (×11): qty 1

## 2015-06-16 MED ORDER — FLUOXETINE HCL 20 MG PO CAPS
20.0000 mg | ORAL_CAPSULE | Freq: Every day | ORAL | Status: DC
  Administered 2015-06-17: 20 mg via ORAL
  Filled 2015-06-16 (×2): qty 1

## 2015-06-16 NOTE — Plan of Care (Signed)
Problem: Ineffective individual coping Goal: STG: Pt will be able to identify effective and ineffective STG: Pt will be able to identify effective and ineffective coping patterns  Outcome: Progressing Patient attends groups regularly.

## 2015-06-16 NOTE — [facility] Group Notes (Signed)
[place] Group Notes:  (Nursing/MHT/Case Management/Adjunct)  Date:  06/16/2015  Time:  4:53 PM  Type of Therapy:  Psychoeducational Skills  Participation Level:  Active  Participation Quality:  Attentive, Sharing and Supportive  Affect:  Appropriate  Cognitive:  Appropriate  Insight:  Appropriate  Engagement in Group:  Supportive  Modes of Intervention:  Discussion and Education  Summary of Progress/Problems:  Charise Killian 06/16/2015, 4:53 PM

## 2015-06-16 NOTE — Plan of Care (Signed)
Problem: Ineffective individual coping Goal: LTG: Patient will report a decrease in negative feelings Outcome: Progressing Patient does not report any negative feelings this shift . 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

## 2015-06-16 NOTE — Progress Notes (Signed)
[facility] MD Progress Note  06/16/2015 1:16 PM Arthur Howe  MRN:  [id number]  Subjective:  Arthur Howe reports more improvement today. Mood continues to improve, affect is brighter, he smiled a little during treatment team meeting. He tolerates medications well. There are no side effects from Luvox or Minipress that recently added. He has been able to better handle his anxiety and in spite of 2 panic attacks in the past 24 hours he has not asked for Klonopin. He has urinary tract infection and we will start [name] today as he is allergic to penicillin. He is out in the community all day long interacting with peers and staff appropriately. He participates well in groups. He still has moments where suicidal thoughts come back but has no intention to act on it. Auditory command hallucinations have almost resolved.   Principal Problem: Major depressive disorder, recurrent severe without psychotic features ([name]) Diagnosis:   Patient Active Problem List   Diagnosis Date Noted  . Diverticulitis [K57.92] 08/17/2014  . Acid reflux [K21.9] 10/15/2013  . Headache, migraine [G43.909] 10/15/2013  . 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 Essential hypertension [I10] 10/05/2013  . Syncope [R55] 10/05/2013  . Anxiety [F41.9] 09/14/2013  . Apnea, sleep [G47.30] 09/14/2013  . Major depressive disorder, recurrent severe without psychotic features ([place]) [F33.2] 09/14/2013  . LBP (low back pain) [M54.5] 06/12/2013  . Dupuytren's contracture of foot [M72.2] 06/12/2013  . HLD (hyperlipidemia) [E78.5] 06/12/2013   Total Time spent with patient: 20 minutes  Past Psychiatric History: Depression, anxiety, conversion disorder.  Past Medical History:  Past Medical History  Diagnosis Date  . Syncope and collapse   . Depression   . Hypertension   . Hyperlipidemia   . Plantar fascial fibromatosis   . Anxiety   . Sleep apnea   . Migraine   . Diverticulitis   . Conversion disorder      Past Surgical History  Procedure Laterality Date  . Gallbladder surgery    . Urethra surgery    . Colon surgery     Family History:  Family History  Problem Relation Age of Onset  . Arrhythmia Maternal Grandmother     heart murmur   . Hypertension Maternal Grandmother   . Hypertension Mother   . Anxiety disorder Mother   . Heart failure Father   . Bipolar disorder Paternal Uncle   . Schizophrenia Paternal Uncle    Family Psychiatric  History: See H&P Social History:  History  Alcohol Use No     History  Drug Use No    Social History   Social History  . Marital Status: Married    Spouse Name: N/A  . Number of Children: N/A  . Years of Education: N/A   Social History Main Topics  . Smoking status: Never Smoker   . Smokeless tobacco: Never Used  . Alcohol Use: No  . Drug Use: No  . Sexual Activity: Yes   Other Topics Concern  . None   Social History Narrative   Additional Social History:                         Sleep: Fair  Appetite:  Fair  Current Medications: Current Facility-Administered Medications  Medication Dose Route Frequency Provider Last Rate Last Dose  . acetaminophen (TYLENOL) tablet 650 mg  650 mg Oral Q6H PRN Clovis Fredrickson, MD   650 mg at 06/16/15 0834  . albuterol (PROVENTIL HFA;VENTOLIN HFA) 108 (  90 Base) MCG/ACT inhaler 2 puff  2 puff Inhalation Q6H PRN  B , MD      . alum & mag hydroxide-simeth (MAALOX/MYLANTA) 200-200-20 MG/5ML suspension 30 mL  30 mL Oral Q4H PRN  B , MD   30 mL at 06/16/15 1027  . buPROPion (WELLBUTRIN XL) 24 hr tablet 300 mg  300 mg Oral Daily  B , MD   300 mg at 06/16/15 0835  . clonazePAM (KLONOPIN) tablet 1 mg  1 mg Oral BID PRN Clovis Fredrickson, MD   1 mg at 06/13/15 2128  . FLUoxetine (PROZAC) capsule 40 mg  40 mg Oral Daily Clovis Fredrickson, MD   40 mg at 06/16/15 0836  . fluticasone (FLONASE) 50 MCG/ACT nasal spray 2 spray  2 spray  Each Nare Daily Clovis Fredrickson, MD   2 spray at 06/16/15 0837  . fluvoxaMINE (LUVOX) tablet 100 mg  100 mg Oral QHS Clovis Fredrickson, MD   100 mg at 06/15/15 2137  . loratadine (CLARITIN) tablet 10 mg  10 mg Oral Daily Clovis Fredrickson, MD   10 mg at 06/16/15 0836  . losartan (COZAAR) tablet 100 mg  100 mg Oral Daily  B , MD   100 mg at 06/16/15 0836  . magnesium hydroxide (MILK OF MAGNESIA) suspension 30 mL  30 mL Oral Daily PRN  B , MD      . metoprolol succinate (TOPROL-XL) 24 hr tablet 25 mg  25 mg Oral Daily  B , MD   25 mg at 06/16/15 0835  . mometasone-formoterol (DULERA) 200-5 MCG/ACT inhaler 2 puff  2 puff Inhalation BID Clovis Fredrickson, MD   2 puff at 06/16/15 0837  . nitrofurantoin (macrocrystal-monohydrate) (MACROBID) capsule 100 mg  100 mg Oral Q12H  B , MD   100 mg at 06/16/15 1026  . pantoprazole (PROTONIX) EC tablet 40 mg  40 mg Oral Daily Clovis Fredrickson, MD   40 mg at 06/16/15 0835  . prazosin (MINIPRESS) capsule 2 mg  2 mg Oral QHS Clovis Fredrickson, MD   2 mg at 06/15/15 2137  . risperiDONE (RISPERDAL) tablet 1 mg  1 mg Oral BID Clovis Fredrickson, MD   1 mg at 06/16/15 0835  . SUMAtriptan (IMITREX) tablet 50 mg  50 mg Oral Q2H PRN  B , MD      . topiramate (TOPAMAX) tablet 100 mg  100 mg Oral QHS Clovis Fredrickson, MD   100 mg at 06/15/15 2137  . traZODone (DESYREL) tablet 100 mg  100 mg Oral QHS Clovis Fredrickson, MD   100 mg at 06/15/15 2137    Lab Results:  Results for orders placed or performed during the hospital encounter of 06/13/15 (from the past 48 hour(s))  Basic metabolic panel     Status: Abnormal   Collection Time: 06/16/15  9:24 AM  Result Value Ref Range   Sodium 135 135 - 145 mmol/L   Potassium 3.5 3.5 - 5.1 mmol/L   Chloride 105 101 - 111 mmol/L   CO2 24 22 - 32 mmol/L   Glucose, Bld 152 (H) 65 - 99 mg/dL   BUN 17 6 - 20 mg/dL    Creatinine, Ser 1.14 0.61 - 1.24 mg/dL   Calcium 9.1 8.9 - 10.3 mg/dL   GFR calc non Af Amer >60 >60 mL/min   GFR calc Af Amer >60 >60 mL/min    Comment: (NOTE) The eGFR has been calculated using the  CKD EPI equation. This calculation has not been validated in all clinical situations. eGFR's persistently <60 mL/min signify possible Chronic Kidney Disease.    Anion gap 6 5 - 15    Blood Alcohol level:  No results found for: [facility]  Physical Findings: AIMS:  , ,  ,  , Dental Status Current problems with teeth and/or dentures?: No Does patient usually wear dentures?: No  CIWA:    COWS:     Musculoskeletal: Strength & Muscle Tone: decreased Gait & Station: unsteady Patient leans: N/A  Psychiatric Specialty Exam: Review of Systems  Gastrointestinal: Positive for heartburn.  Psychiatric/Behavioral: Positive for depression and hallucinations. The patient is nervous/anxious.   All other systems reviewed and are negative.   Blood pressure 123/72, pulse 78, temperature 98.6 F (37 C), temperature source Oral, resp. rate 20, height 6' (1.829 m), weight 59.421 kg (131 lb), SpO2 96 %.Body mass index is 17.76 kg/(m^2).  General Appearance: Casual  Eye Contact::  Good  Speech:  Clear and Coherent  Volume:  Normal  Mood:  Anxious  Affect:  Appropriate  Thought Process:  Goal Directed  Orientation:  Full (Time, Place, and Person)  Thought Content:  Hallucinations: Auditory  Suicidal Thoughts:  Yes.  with intent/plan  Homicidal Thoughts:  No  Memory:  Immediate;   Fair Recent;   Fair Remote;   Fair  Judgement:  Fair  Insight:  Fair  Psychomotor Activity:  Normal  Concentration:  Fair  Recall:  [employer] [place]  Language: Fair  Akathisia:  No  Handed:  Right  AIMS (if indicated):     Assets:  Communication Skills Desire for Improvement Financial Resources/Insurance Housing Intimacy Resilience Social Support Transportation  ADL's:  Intact  Cognition: WNL   Sleep:  Number of Hours: 7.15   Treatment Plan Summary: Daily contact with patient to assess and evaluate symptoms and progress in treatment and Medication management   Arthur [facility] is a 50 year old male with a history of depression, anxiety, and conversion disorder admitting for worsening of his symptoms and new onset auditory command hallucinations telling him self in the context of medication readjustment and severe social stressors.  1. Suicidal ideation. The patient is able to contract for safety in the hospital.  2. Mood. He has been maintained on a combination of Wellbutrin and Prozac. We started crosstaper of Prozac to Luvox to better address severe anxiety. We added Risperdal for psychosis and augmentation.  3. Anxiety. He was maintained on Klonopin 1 mg twice daily as needed. When this was lowered the patient started experiencing severe anxiety and worsening of depression. We continue Minipress for nightmares.  4. COPD. He is on albuterol and spiriva.  5. Allergies. He is on Advertising account planner.  6. Migraine headache. He is on Topamax for headache prevention and Imitrex. We increased Topamax to 100 mg.   7. Hypertension. He is on Toprol-XL and Cozaar.  8. Insomnia. He is on trazodone.  9. Sleep apnea. His wife brought his CPAP machine.  10. Fall risk. The patient experiences weakness in lower extremities and difficulty speaking. He was ruled out for stroke by Dr. Melrose Nakayama and Dr. Allen Norris. He was told that he has conversion disorder. At home he uses a cane and a wheelchair occasionally. PT consult is greatly appreciated.   11. Metabolic syndromes screening. Triglycerides are elevated at 231, hemoglobin A1c, TSH, and prolactin are normal.   12. UTI. Urine culture positive for E. Foecalis. The patient is allergic to pennicillin.  After consultation with the pharmacist, we started [place].   13. GERD. We increased Protonix to 40 mg bid.  14. Disposition. He will be discharged  to home with family. He will need a follow-up appointment with a new psychiatrist   Orson Slick, MD 06/16/2015, 1:16 PM

## 2015-06-16 NOTE — Progress Notes (Signed)
Pt is alert and oriented this shift, mood is improved, pt denies any SI/HI/VAH, remains compliant with medications. No concerns voiced, pt monitored closely for safety.
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
## 2015-06-16 NOTE — Progress Notes (Signed)
Physical Therapy Treatment Patient Details Name: Arthur Howe MRN: [id number] DOB: 06/19/1965 Today's Date: 06/16/2015    History of Present Illness Pt is a 50 y.o. male with PMH of depression, anxiety, COPD, HTN and conversion disorder.  Pt presented with auditory hallucinations and panic attacks.  Pt was admitted for suicidal ideation.      PT Comments    Pt was independent with bed mobility.  Pt was CGA for 4 sets of sit to stand with RW and 2 sets of 80 feet ambulation with RW.  Pt performed standing balance activities and required min assist for tandem stance with L foot forward and eyes closed.  Pt performed UE and LE AROM exercises sitting EOB.  Pt scored a 18/28 on the Tinetti Balance Assessment Tool which is correlated to a high risk for falls.  Pt reported and appeared to have increased mobility and energy compared to last session.  Next session PT will attempt increased ambulation distance and dynamic balance activities.     Follow Up Recommendations  Home health PT     Equipment Recommendations  Rolling walker with 5" wheels    Recommendations for Other Services       Precautions / Restrictions Precautions Precautions: Fall Restrictions Weight Bearing Restrictions: No    Mobility  Bed Mobility Overal bed mobility: Independent                Transfers Overall transfer level: Needs assistance Equipment used: Rolling walker (2 wheeled) Transfers: Sit to/from Stand (4 sets ) Sit to Stand: Min guard         General transfer comment: increased time   Ambulation/Gait Ambulation/Gait assistance: Min guard Ambulation Distance (Feet):  (2 sets of 80 feet ) Assistive device: Rolling walker (2 wheeled) Gait Pattern/deviations: Decreased stance time - right;Decreased stance time - left;Step-to pattern Gait velocity: decreased        Stairs            Wheelchair Mobility    Modified Rankin (Stroke Patients Only)       Balance Overall  balance assessment: Needs assistance Sitting-balance support: Feet supported Sitting balance-Leahy Scale: Good     Standing balance support: Bilateral upper extremity supported (RW ) Standing balance-Leahy Scale: Fair      Tinetti Balance Assessment Tool:  Overall: 18/28  (High Risk of Falls) Balance: 13/16 Rises from chair: 1/2   Eyes closed: 0/1  Sitting Down: 1/2    Gait: 6/12  Step Length: 1/2 Foot Clearance: 1/2 Step symmetry: 0/1  step continuity: 0/1 Path: 1/2 Trunk 1/2   Cognition Arousal/Alertness: Awake/alert Behavior During Therapy: WFL for tasks assessed/performed Overall Cognitive Status: Within Functional Limits for tasks assessed                      Exercises Total Joint Exercises Ankle Circles/Pumps: AROM;Both;10 reps Long Arc Quad: AROM;Both;10 reps General Exercises - Upper Extremity Shoulder ABduction: AROM;Both;10 reps Elbow Flexion: AROM;Both;10 reps General Exercises - Lower Extremity Hip Flexion/Marching: AROM;Both;10 reps (sitting EOB)  All exercises were performed sitting EOB    General Comments   Pt was agreeable and tolerated session well.        Pertinent Vitals/Pain Pain Assessment: No/denies pain Pain Score: 3  Pain Location: R LE and L hip  Pain Descriptors / Indicators: Aching;Discomfort Pain Intervention(s): Limited activity within patient's tolerance;Monitored during session;Repositioned  See flow sheet for vitals.     Home Living  Prior Function            PT Goals (current goals can now be found in the care plan section) Acute Rehab PT Goals Patient Stated Goal: to go home  PT Goal Formulation: With patient Time For Goal Achievement: 06/28/15 Potential to Achieve Goals: Good Progress towards PT goals: Progressing toward goals    Frequency  Min 2X/week    PT Plan Current plan remains appropriate    Co-evaluation             End of Session Equipment Utilized  During Treatment: Gait belt Activity Tolerance: Patient limited by fatigue Patient left: in bed;with call bell/phone within reach     Time: [id number] PT Time Calculation (min) (ACUTE ONLY): 24 min  Charges:                       G Codes:      Mittie Bodo, SPT Mittie Bodo 06/16/2015, 11:29 AM

## 2015-06-16 NOTE — Plan of Care (Signed)
Problem: Alteration in mood Goal: LTG-Pt's behavior demonstrates decreased signs of depression (Patient's behavior demonstrates decreased signs of depression to the point the patient is safe to return home and continue treatment in an outpatient setting)  Outcome: Progressing Pt reports decrease signs of depression.

## 2015-06-16 NOTE — Progress Notes (Signed)
D: Patient is alert and oriented on the unit this shift. Patient attended and actively participated in groups today. Patient denies suicidal ideation, homicidal ideation, auditory or visual hallucinations at the present time.  A: Scheduled medications are administered to patient as per MD orders. Emotional support and encouragement are provided. Patient is maintained on q.15 minute safety checks. Patient is informed to notify staff with questions or concerns. R: No adverse medication reactions are noted. Patient is cooperative with medication administration and treatment plan today. Patient is receptive,  Anxious but  cooperative on the unit at this time. Patient has minimal interation with others on the unit this shift. Patient contracts for safety at this time. Patient remains safe at this time.

## 2015-06-16 NOTE — BHH Group Notes (Signed)
[facility] LCSW Group Therapy  06/16/2015 10:34 AM  Type of Therapy:  Group Therapy  Participation Level:  Active  Participation Quality:  Attentive  Affect:  Appropriate  Cognitive:  Alert  Insight:  Improving  Engagement in Therapy:  Improving  Modes of Intervention:  Discussion, Education, Socialization and Support  Summary of Progress/Problems: Balance in life: Patients will discuss the concept of balance and how it looks and feels to be unbalanced. Pt will identify areas in their life that is unbalanced and ways to become more balanced. Arthur Howe attended group and stayed the entire time. He states medication changes threw him off balance.     [place] MSW, [place]  06/16/2015, 10:34 AM

## 2015-06-16 NOTE — Progress Notes (Signed)
Patient pleasant. Attended and participated in groups. He appeared to have a flat depressed affect. He denied SI and contacted for safety. He also denied AH/VH stated that he had some last night but not today. Ambulated with PT. He c/o indigestion/reflux and PRN Maalox was given with positive results. No Falls noted.

## 2015-06-17 MED ORDER — FLUVOXAMINE MALEATE 50 MG PO TABS
200.0000 mg | ORAL_TABLET | Freq: Every day | ORAL | Status: DC
  Administered 2015-06-17 – 2015-06-19 (×3): 200 mg via ORAL
  Filled 2015-06-17 (×3): qty 4

## 2015-06-17 NOTE — BHH Group Notes (Signed)
  [facility] LCSW Aftercare Discharge Planning Group Note   06/17/2015 11:51 AM  Participation Quality:  Active   Mood/Affect:  Appropriate  Depression Rating:  0  Anxiety Rating:  0  Thoughts of Suicide:  No Will you contract for safety?   NA  Current AVH:  NA  Plan for Discharge/Comments: Pt plans to return home and follow up with outpatient. Pt reports he needs a outpatient psychiatrist in [place].   Transportation Means: Wife   Supports: family   Higher education careers adviser MSW, [place]

## 2015-06-17 NOTE — BHH Group Notes (Signed)
[place] Group Notes:  (Nursing/MHT/Case Management/Adjunct)  Date:  06/17/2015  Time:  2:29 AM  Type of Therapy:  Group Therapy  Participation Level:  Active  Participation Quality:  Appropriate  Affect:  Appropriate  Cognitive:  Appropriate  Insight:  Appropriate  Engagement in Group:  Engaged  Modes of Intervention:  n/a  Summary of Progress/Problems:  Arthur Howe 06/17/2015, 2:29 AM

## 2015-06-17 NOTE — Progress Notes (Signed)
[facility] MD Progress Note  06/17/2015 11:54 AM Arthur Howe  MRN:  [id number]  Subjective:  Arthur Howe reports some further improvement. He only has passing suicidal thoughts when he worries about his disability. He has been able to stay out of his room and participating religiously and classes. He's been able to use coping skills to avoid asking for as needed Klonopin. He seems to tolerate medication adjustments well. Yesterday he was started on Macrobid for UTI. We gradually discontinue Prozac and started Luvox for depression and anxiety. We also added Minipress for nightmares and Risperdal for auditory hallucinations that are now resolving. The patient is sitting in a wheelchair, working with PT, due to lower extremity weakness that cannot be explained by physical findings.  Principal Problem: Major depressive disorder, recurrent severe without psychotic features (Arthur Howe) Diagnosis:   Patient Active Problem List   Diagnosis Date Noted  . Diverticulitis [K57.92] 08/17/2014  . Acid reflux [K21.9] 10/15/2013  . Headache, migraine [G43.909] 10/15/2013  . 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 Essential hypertension [I10] 10/05/2013  . Syncope [R55] 10/05/2013  . Anxiety [F41.9] 09/14/2013  . Apnea, sleep [G47.30] 09/14/2013  . Major depressive disorder, recurrent severe without psychotic features (Arthur Howe) [F33.2] 09/14/2013  . LBP (low back pain) [M54.5] 06/12/2013  . Dupuytren's contracture of foot [M72.2] 06/12/2013  . HLD (hyperlipidemia) [E78.5] 06/12/2013   Total Time spent with patient: 20 minutes  Past Psychiatric History: Depression, anxiety.  Past Medical History:  Past Medical History  Diagnosis Date  . Syncope and collapse   . Depression   . Hypertension   . Hyperlipidemia   . Plantar fascial fibromatosis   . Anxiety   . Sleep apnea   . Migraine   . Diverticulitis   . Conversion disorder     Past Surgical History  Procedure Laterality Date  .  Gallbladder surgery    . Urethra surgery    . Colon surgery     Family History:  Family History  Problem Relation Age of Onset  . Arrhythmia Maternal Grandmother     heart murmur   . Hypertension Maternal Grandmother   . Hypertension Mother   . Anxiety disorder Mother   . Heart failure Father   . Bipolar disorder Paternal Uncle   . Schizophrenia Paternal Uncle    Family Psychiatric  History: See H&P. Social History:  History  Alcohol Use No     History  Drug Use No    Social History   Social History  . Marital Status: Married    Spouse Name: N/A  . Number of Children: N/A  . Years of Education: N/A   Social History Main Topics  . Smoking status: Never Smoker   . Smokeless tobacco: Never Used  . Alcohol Use: No  . Drug Use: No  . Sexual Activity: Yes   Other Topics Concern  . None   Social History Narrative   Additional Social History:                         Sleep: Fair  Appetite:  Fair  Current Medications: Current Facility-Administered Medications  Medication Dose Route Frequency Provider Last Rate Last Dose  . acetaminophen (TYLENOL) tablet 650 mg  650 mg Oral Q6H PRN Arthur Fredrickson, MD   650 mg at 06/16/15 0834  . albuterol (PROVENTIL HFA;VENTOLIN HFA) 108 (90 Base) MCG/ACT inhaler 2 puff  2 puff Inhalation Q6H PRN Arthur Fredrickson, MD      .  alum & mag hydroxide-simeth (MAALOX/MYLANTA) 200-200-20 MG/5ML suspension 30 mL  30 mL Oral Q4H PRN Arthur Howe B Arthur Delpilar, MD   30 mL at 06/16/15 1027  . buPROPion (WELLBUTRIN XL) 24 hr tablet 300 mg  300 mg Oral Daily Arthur Howe B Arthur Wimberley, MD   300 mg at 06/17/15 0846  . clonazePAM (KLONOPIN) tablet 1 mg  1 mg Oral BID PRN Arthur Fredrickson, MD   1 mg at 06/13/15 2128  . FLUoxetine (PROZAC) capsule 20 mg  20 mg Oral Daily Arthur Howe B Arthur Meisenheimer, MD   20 mg at 06/17/15 0847  . fluticasone (FLONASE) 50 MCG/ACT nasal spray 2 spray  2 spray Each Nare Daily Arthur Fredrickson, MD   2 spray at  06/17/15 0843  . fluvoxaMINE (LUVOX) tablet 100 mg  100 mg Oral QHS Arthur Fredrickson, MD   100 mg at 06/16/15 2047  . loratadine (CLARITIN) tablet 10 mg  10 mg Oral Daily Arthur Fredrickson, MD   10 mg at 06/17/15 0847  . losartan (COZAAR) tablet 100 mg  100 mg Oral Daily Arthur Howe B Arthur Schooler, MD   100 mg at 06/17/15 0847  . magnesium hydroxide (MILK OF MAGNESIA) suspension 30 mL  30 mL Oral Daily PRN Arthur Howe B Arthur Delpozo, MD      . metoprolol succinate (TOPROL-XL) 24 hr tablet 25 mg  25 mg Oral Daily Arthur Howe B Arthur Abate, MD   25 mg at 06/17/15 0847  . mometasone-formoterol (DULERA) 200-5 MCG/ACT inhaler 2 puff  2 puff Inhalation BID Arthur Fredrickson, MD   2 puff at 06/17/15 0843  . nitrofurantoin (macrocrystal-monohydrate) (MACROBID) capsule 100 mg  100 mg Oral Q12H Arthur Howe B Arthur Ozdemir, MD   100 mg at 06/17/15 0847  . pantoprazole (PROTONIX) EC tablet 40 mg  40 mg Oral BID AC Arthur Howe B Arthur Zurn, MD   40 mg at 06/17/15 0847  . prazosin (MINIPRESS) capsule 2 mg  2 mg Oral QHS Arthur Howe B Arthur Douse, MD   2 mg at 06/16/15 2048  . risperiDONE (RISPERDAL) tablet 1 mg  1 mg Oral BID Arthur Fredrickson, MD   1 mg at 06/17/15 0847  . SUMAtriptan (IMITREX) tablet 50 mg  50 mg Oral Q2H PRN Arthur Howe B Arthur Ruffini, MD      . topiramate (TOPAMAX) tablet 100 mg  100 mg Oral QHS Arthur Howe B Arthur Sui, MD   100 mg at 06/16/15 2048  . traZODone (DESYREL) tablet 100 mg  100 mg Oral QHS Arthur Fredrickson, MD   100 mg at 06/16/15 2049    Lab Results:  Results for orders placed or performed during the hospital encounter of 06/13/15 (from the past 48 hour(s))  Basic metabolic panel     Status: Abnormal   Collection Time: 06/16/15  9:24 AM  Result Value Ref Range   Sodium 135 135 - 145 mmol/L   Potassium 3.5 3.5 - 5.1 mmol/L   Chloride 105 101 - 111 mmol/L   CO2 24 22 - 32 mmol/L   Glucose, Bld 152 (H) 65 - 99 mg/dL   BUN 17 6 - 20 mg/dL   Creatinine, Ser 1.14 0.61 - 1.24 mg/dL   Calcium 9.1 8.9  - 10.3 mg/dL   GFR calc non Af Amer >60 >60 mL/min   GFR calc Af Amer >60 >60 mL/min    Comment: (NOTE) The eGFR has been calculated using the CKD EPI equation. This calculation has not been validated in all clinical situations. eGFR's persistently <60 mL/min signify possible Chronic Kidney  Disease.    Anion gap 6 5 - 15    Blood Alcohol level:  No results found for: [facility]  Physical Findings: AIMS:  , ,  ,  , Dental Status Current problems with teeth and/or dentures?: No Does patient usually wear dentures?: No  CIWA:    COWS:     Musculoskeletal: Strength & Muscle Tone: decreased Gait & Station: unsteady Patient leans: N/A  Psychiatric Specialty Exam: Review of Systems  Musculoskeletal: Positive for falls.  Neurological: Positive for weakness.  Psychiatric/Behavioral: Positive for depression and suicidal ideas.  All other systems reviewed and are negative.   Blood pressure 113/83, pulse 73, temperature 98 F (36.7 C), temperature source Oral, resp. rate 18, height 6' (1.829 m), weight 59.421 kg (131 lb), SpO2 96 %.Body mass index is 17.76 kg/(m^2).  General Appearance: Casual  Eye Contact::  Good  Speech:  Clear and Coherent  Volume:  Normal  Mood:  Anxious and Depressed  Affect:  Blunt  Thought Process:  Goal Directed  Orientation:  Full (Time, Place, and Person)  Thought Content:  Hallucinations: Auditory  Suicidal Thoughts:  Yes.  with intent/plan  Homicidal Thoughts:  No  Memory:  Immediate;   Fair Recent;   Fair Remote;   Fair  Judgement:  Impaired  Insight:  Shallow  Psychomotor Activity:  Normal  Concentration:  Fair  Recall:  [place]  Language: Fair  Akathisia:  No  Handed:  Right  AIMS (if indicated):     Assets:  Communication Skills Desire for Improvement Financial Resources/Insurance Housing Intimacy Resilience Social Support  ADL's:  Intact  Cognition: WNL  Sleep:  Number of Hours: 7.15   Treatment Plan  Summary: Daily contact with patient to assess and evaluate symptoms and progress in treatment and Medication management   Mr. Melland is a 50 year old male with a history of depression, anxiety, and conversion disorder admitting for worsening of his symptoms and new onset auditory command hallucinations telling him self in the context of medication readjustment and severe social stressors.  1. Suicidal ideation. The patient is able to contract for safety in the hospital.  2. Mood. He has been maintained on a combination of Wellbutrin and Prozac. We crosstaper of Prozac to Luvox to better address severe anxiety. We added Risperdal for psychosis and augmentation.  3. Anxiety. He was maintained on Klonopin 1 mg twice daily as needed. He has not been using it in the hospital. We continue Minipress for nightmares.  4. COPD. He is on albuterol and spiriva.  5. Allergies. He is on Advertising account planner.  6. Migraine headache. He is on Topamax for headache prevention and Imitrex. We increased Topamax to 100 mg.   7. Hypertension. He is on Toprol-XL and Cozaar.  8. Insomnia. He is on trazodone.  9. Sleep apnea. He uses his CPAP machine.  10. Fall risk. The patient experiences weakness in lower extremities and difficulty speaking. He was ruled out for stroke by Dr. Melrose Nakayama and Dr. Allen Norris. He was told that he has conversion disorder. At home he uses a cane and a wheelchair occasionally. PT consult is greatly appreciated.   11. Metabolic syndromes screening. Triglycerides are elevated at 231, hemoglobin A1c, TSH, and prolactin are normal.   12. UTI. Urine culture positive for E. Foecalis. The patient is allergic to pennicillin. After consultation with the pharmacist, we started [place].   13. GERD. We increased Protonix to 40 mg bid.  14. Disposition. He will be discharged  to home with family. He will need a follow-up appointment with a new psychiatrist   Orson Slick, MD 06/17/2015,  11:54 AM

## 2015-06-17 NOTE — Tx Team (Signed)
Interdisciplinary Treatment Plan Update (Adult)  Date:  06/17/2015 Time Reviewed:  3:14 PM  Progress in Treatment: Attending groups: Yes. Participating in groups:  Yes. Taking medication as prescribed:  Yes. Tolerating medication:  Yes. Family/Significant othe contact made:  Yes, individual(s) contacted:  patient's wife Patient understands diagnosis:  Yes. Discussing patient identified problems/goals with staff:  Yes. Medical problems stabilized or resolved:  Yes. Denies suicidal/homicidal ideation: Yes. Issues/concerns per patient self-inventory:  Yes. Other:  New problem(s) identified: No, Describe:  none reported  Discharge Plan or Barriers: Patient will stabilize and discharge home with outpatient follow up scheduled.  Reason for Continuation of Hospitalization: Depression Medication stabilization Suicidal ideation  Comments:  Estimated length of stay: up to 4 days, expected discharge Monday 06/20/15  New goal(s):  Review of initial/current patient goals per problem list:   1.  Goal(s): Participate in aftercare plan   Met:  No  Target date: by discharge  As evidenced by: patient will participate in aftercare plan AEB aftercare provider and housing plan identified at discharge 06/14/15: Patient does not have a provider and will need one identified that takes his insurance by discharge.  06/16/15: Patient is referred to an outpatient provider in [place] but awaiting an appt. Patient can return home with wife once stabilized and discharged.    2.  Goal (s): Decrease depression   Met:  No  Target date:  As evidenced by: patient demonstrates decreased symptoms of depression and reports a Depression rating of 3 or less 06/14/15: Patient isolates in bed and not much participation in milieu appears depressed.  06/16/15: Patient reports he is feeling much better but will continue to be monitored for symptoms of depression.    Attendees: Patient:  Arthur Howe  4/14/20173:14 PM  Physician:  Orson Slick, MD 4/14/20173:14 PM  Nursing:   Polly Cobia, RN 4/14/20173:14 PM  Other:  Carmell Austria, LCSW 4/14/20173:14 PM  Other:  Garald Braver, Ph D. 4/14/20173:14 PM  Other:  Abran Cantor, RN 4/14/20173:14 PM  Other:  4/14/20173:14 PM  Other:  4/14/20173:14 PM  Other:  4/14/20173:14 PM  Other:  4/14/20173:14 PM  Other:  4/14/20173:14 PM  Other:  4/14/20173:14 PM  Other:   4/14/20173:14 PM   Scribe for Treatment Team:   Keene Breath, MSW, LCSW  06/17/2015, 3:14 PM

## 2015-06-17 NOTE — Plan of Care (Signed)
Problem: Ineffective individual coping Goal: STG:Pt. will utilize relaxation techniques to reduce stress STG: Patient will utilize relaxation techniques to reduce stress levels  Outcome: Progressing Patient verbalizes ways to relax by redirection to reduce anxiety

## 2015-06-17 NOTE — B[facility] Suicide Risk Assessment (Signed)
Arthur Howe INPATIENT:  Family/Significant Other Suicide Prevention Education  Suicide Prevention Education:  Education Completed; Arthur Howe (wife) [phone] has been identified by the patient as the family member/significant other with whom the patient will be residing, and identified as the person(s) who will aid the patient in the event of a mental health crisis (suicidal ideations/suicide attempt).  With written consent from the patient, the family member/significant other has been provided the following suicide prevention education, prior to the and/or following the discharge of the patient.  The suicide prevention education provided includes the following:  Suicide risk factors  Suicide prevention and interventions  National Suicide Hotline telephone number  [facility] assessment telephone number  [facility] Emergency Assistance [place] and/or Residential Mobile Crisis Unit telephone number  Request made of family/significant other to:  Remove weapons (e.g., guns, rifles, knives), all items previously/currently identified as safety concern.    Remove drugs/medications (over-the-counter, prescriptions, illicit drugs), all items previously/currently identified as a safety concern.  The family member/significant other verbalizes understanding of the suicide prevention education information provided.  The family member/significant other agrees to remove the items of safety concern listed above.  Keene Breath, MSW, LCSW 06/17/2015, 3:13 PM

## 2015-06-17 NOTE — Plan of Care (Signed)
Problem: Ineffective individual coping Goal: LTG: Patient will report a decrease in negative feelings Outcome: Progressing Pt reports feeling much better

## 2015-06-17 NOTE — Progress Notes (Signed)
D: Patient is alert and oriented on the unit this shift. Patient attended and actively participated in groups today. Patient denies suicidal ideation, homicidal ideation, auditory or visual hallucinations at the present time.  A: Scheduled medications are administered to patient as per MD orders. Emotional support and encouragement are provided. Patient is maintained on q.15 minute safety checks. Patient is informed to notify staff with questions or concerns. R: No adverse medication reactions are noted. Patient is cooperative with medication administration and treatment plan today. Patient is receptive, calm ,sullen and cooperative on the unit at this time. Patient interacts fair  with others on the unit this shift. Patient contracts for safety at this time. Patient remains safe at this time.

## 2015-06-17 NOTE — Plan of Care (Signed)
Problem: Ineffective individual coping Goal: LTG: Patient will report a decrease in negative feelings Outcome: Progressing Patient states he will let nurse or tech know if he is having negative feelings

## 2015-06-17 NOTE — Progress Notes (Signed)
Pt pleasant and cooperative with care. Denies SI, HI, AVH. Pt feeling hopeful, reports medications are helping. Concerned about Dr that's rounding this weekend. States she took him off his meds and is fearful she will do it again. Encouragement and support offered and that she was only the rounding physician for the weekend and his regularly doctor will be back on next week. Pt was receptive and remained positive.  Pt remains safe on unit with q15 min checks

## 2015-06-17 NOTE — [facility] Group Notes (Signed)
[place] LCSW Group Therapy  06/17/2015 2:54 PM  Type of Therapy:  Group Therapy  Participation Level:  Active  Participation Quality:  Attentive  Affect:  Appropriate  Cognitive:  Alert  Insight:  Improving  Engagement in Therapy:  Improving  Modes of Intervention:  Discussion, Education, Socialization and Support  Summary of Progress/Problems: Feelings around Relapse. Group members discussed the meaning of relapse and shared personal stories of relapse, how it affected them and others, and how they perceived themselves during this time. Group members were encouraged to identify triggers, warning signs and coping skills used when facing the possibility of relapse. Social supports were discussed and explored in detail. Arthur Howe attended group and stayed the entire time. He discussed triggers such as family problems and changes in medications.   [place] MSW, [place]  06/17/2015, 2:54 PM

## 2015-06-18 DIAGNOSIS — R45851 Suicidal ideations: Secondary | ICD-10-CM

## 2015-06-18 NOTE — Progress Notes (Signed)
Patient ID: PETR HOLTE, male   DOB: 1965/07/28, 50 y.o.   MRN: [id number] [facility] MD Progress Note  06/18/2015 11:03 AM LANDER HEMAUER  MRN:  [id number]  Subjective:  Mr. Robledo is known to this clinician since he is seen in outpatient by this M.D. and she was responsible for his admission. Today he reports that he is doing much better. States his mood is better and his been sleeping and eating well. States that there have been some changes made with his medications and is tolerating this change is better. Still denies any suicidal thoughts. States that he did not realize he needed hospitalization but once he got here he knows that he needed to be in here. He has been interacting well with the other staff and peers and participating in groups.  Reports he was diagnosed with a urinary tract infection here in his being treated for it.  Principal Problem: Major depressive disorder, recurrent severe without psychotic features ([place]) Diagnosis:   Patient Active Problem List   Diagnosis Date Noted  . Diverticulitis [K57.92] 08/17/2014  . Acid reflux [K21.9] 10/15/2013  . Headache, migraine [G43.909] 10/15/2013  . 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 Essential hypertension [I10] 10/05/2013  . Syncope [R55] 10/05/2013  . Anxiety [F41.9] 09/14/2013  . Apnea, sleep [G47.30] 09/14/2013  . Major depressive disorder, recurrent severe without psychotic features ([place]) [F33.2] 09/14/2013  . LBP (low back pain) [M54.5] 06/12/2013  . Dupuytren's contracture of foot [M72.2] 06/12/2013  . HLD (hyperlipidemia) [E78.5] 06/12/2013   Total Time spent with patient: 20 minutes  Past Psychiatric History: Depression, anxiety, conversion disorder.  Past Medical History:  Past Medical History  Diagnosis Date  . Syncope and collapse   . Depression   . Hypertension   . Hyperlipidemia   . Plantar fascial fibromatosis   . Anxiety   . Sleep apnea   . Migraine   . Diverticulitis   .  Conversion disorder     Past Surgical History  Procedure Laterality Date  . Gallbladder surgery    . Urethra surgery    . Colon surgery     Family History:  Family History  Problem Relation Age of Onset  . Arrhythmia Maternal Grandmother     heart murmur   . Hypertension Maternal Grandmother   . Hypertension Mother   . Anxiety disorder Mother   . Heart failure Father   . Bipolar disorder Paternal Uncle   . Schizophrenia Paternal Uncle    Family Psychiatric  History: See H&P Social History:  History  Alcohol Use No     History  Drug Use No    Social History   Social History  . Marital Status: Married    Spouse Name: N/A  . Number of Children: N/A  . Years of Education: N/A   Social History Main Topics  . Smoking status: Never Smoker   . Smokeless tobacco: Never Used  . Alcohol Use: No  . Drug Use: No  . Sexual Activity: Yes   Other Topics Concern  . None   Social History Narrative   Additional Social History:                         Sleep: Fair  Appetite:  Fair  Current Medications: Current Facility-Administered Medications  Medication Dose Route Frequency Provider Last Rate Last Dose  . acetaminophen (TYLENOL) tablet 650 mg  650 mg Oral Q6H PRN Clovis Fredrickson, MD  650 mg at 06/16/15 0834  . albuterol (PROVENTIL HFA;VENTOLIN HFA) 108 (90 Base) MCG/ACT inhaler 2 puff  2 puff Inhalation Q6H PRN Jolanta B Pucilowska, MD      . alum & mag hydroxide-simeth (MAALOX/MYLANTA) 200-200-20 MG/5ML suspension 30 mL  30 mL Oral Q4H PRN Jolanta B Pucilowska, MD   30 mL at 06/16/15 1027  . buPROPion (WELLBUTRIN XL) 24 hr tablet 300 mg  300 mg Oral Daily Jolanta B Pucilowska, MD   300 mg at 06/18/15 0923  . clonazePAM (KLONOPIN) tablet 1 mg  1 mg Oral BID PRN Clovis Fredrickson, MD   1 mg at 06/13/15 2128  . fluticasone (FLONASE) 50 MCG/ACT nasal spray 2 spray  2 spray Each Nare Daily Clovis Fredrickson, MD   2 spray at 06/18/15 0924  . fluvoxaMINE  (LUVOX) tablet 200 mg  200 mg Oral QHS Jolanta B Pucilowska, MD   200 mg at 06/17/15 2047  . loratadine (CLARITIN) tablet 10 mg  10 mg Oral Daily Clovis Fredrickson, MD   10 mg at 06/18/15 0923  . losartan (COZAAR) tablet 100 mg  100 mg Oral Daily Clovis Fredrickson, MD   100 mg at 06/18/15 0923  . magnesium hydroxide (MILK OF MAGNESIA) suspension 30 mL  30 mL Oral Daily PRN Jolanta B Pucilowska, MD      . metoprolol succinate (TOPROL-XL) 24 hr tablet 25 mg  25 mg Oral Daily Jolanta B Pucilowska, MD   25 mg at 06/18/15 0923  . mometasone-formoterol (DULERA) 200-5 MCG/ACT inhaler 2 puff  2 puff Inhalation BID Clovis Fredrickson, MD   2 puff at 06/18/15 0923  . nitrofurantoin (macrocrystal-monohydrate) (MACROBID) capsule 100 mg  100 mg Oral Q12H Jolanta B Pucilowska, MD   100 mg at 06/18/15 0923  . pantoprazole (PROTONIX) EC tablet 40 mg  40 mg Oral BID AC Jolanta B Pucilowska, MD   40 mg at 06/18/15 0923  . prazosin (MINIPRESS) capsule 2 mg  2 mg Oral QHS Clovis Fredrickson, MD   2 mg at 06/17/15 2045  . risperiDONE (RISPERDAL) tablet 1 mg  1 mg Oral BID Clovis Fredrickson, MD   1 mg at 06/18/15 0923  . SUMAtriptan (IMITREX) tablet 50 mg  50 mg Oral Q2H PRN Jolanta B Pucilowska, MD      . topiramate (TOPAMAX) tablet 100 mg  100 mg Oral QHS Jolanta B Pucilowska, MD   100 mg at 06/17/15 2046  . traZODone (DESYREL) tablet 100 mg  100 mg Oral QHS Clovis Fredrickson, MD   100 mg at 06/17/15 2046    Lab Results:  No results found for this or any previous visit (from the past 48 hour(s)).  Blood Alcohol level:  No results found for: [facility]  Physical Findings: AIMS:  , ,  ,  , Dental Status Current problems with teeth and/or dentures?: No Does patient usually wear dentures?: No  CIWA:    COWS:     Musculoskeletal: Strength & Muscle Tone: decreased Gait & Station: unsteady Patient leans: N/A  Psychiatric Specialty Exam: Review of Systems  Gastrointestinal: Positive for heartburn.   Psychiatric/Behavioral: Positive for depression and hallucinations. The patient is nervous/anxious.   All other systems reviewed and are negative.   Blood pressure 105/57, pulse 67, temperature 98.2 F (36.8 C), temperature source Oral, resp. rate 18, height 6' (1.829 m), weight 131 lb (59.421 kg), SpO2 96 %.Body mass index is 17.76 kg/(m^2).  General Appearance: Casual  Eye Contact::  Good  Speech:  Clear and Coherent  Volume:  Normal  Mood:  Anxious  Affect:  Appropriate  Thought Process:  Goal Directed  Orientation:  Full (Time, Place, and Person)  Thought Content:  Denies any today   Suicidal Thoughts:  Yes.  with intent/plan  Homicidal Thoughts:  No  Memory:  Immediate;   Fair Recent;   Fair Remote;   Fair  Judgement:  Fair  Insight:  Fair  Psychomotor Activity:  Normal  Concentration:  Fair  Recall:  [employer] [place]  Language: Fair  Akathisia:  No  Handed:  Right  AIMS (if indicated):     Assets:  Communication Skills Desire for Improvement Financial Resources/Insurance Housing Intimacy Resilience Social Support Transportation  ADL's:  Intact  Cognition: WNL  Sleep:  Number of Hours: 6.45   Treatment Plan Summary: Daily contact with patient to assess and evaluate symptoms and progress in treatment and Medication management   Mr. Agar is a 50 year old male with a history of depression, anxiety, and conversion disorder admitting for worsening of his symptoms and new onset auditory command hallucinations telling him self in the context of medication readjustment and severe social stressors.  1. Suicidal ideation. The patient is able to contract for safety in the hospital.  2. Mood. He has been maintained on a combination of Wellbutrin and Prozac. We started crosstaper of Prozac to Luvox to better address severe anxiety. We added Risperdal for psychosis and augmentation.  3. Anxiety. He was maintained on Klonopin 1 mg twice daily as needed. When  this was lowered the patient started experiencing severe anxiety and worsening of depression. We continue Minipress for nightmares.  4. COPD. He is on albuterol and spiriva.  5. Allergies. He is on Advertising account planner.  6. Migraine headache. He is on Topamax for headache prevention and Imitrex. We increased Topamax to 100 mg.   7. Hypertension. He is on Toprol-XL and Cozaar.  8. Insomnia. He is on trazodone.  9. Sleep apnea. His wife brought his CPAP machine.  10. Fall risk. The patient experiences weakness in lower extremities and difficulty speaking. He was ruled out for stroke by Dr. Melrose Nakayama and Dr. Allen Norris. He was told that he has conversion disorder. At home he uses a cane and a wheelchair occasionally. PT consult is greatly appreciated.   11. Metabolic syndromes screening. Triglycerides are elevated at 231, hemoglobin A1c, TSH, and prolactin are normal.   12. UTI. Urine culture positive for E. Foecalis. The patient is allergic to pennicillin. After consultation with the pharmacist, we started [place].   13. GERD. We increased Protonix to 40 mg bid.  14. Disposition. He will be discharged to home with family. He will need a follow-up appointment with a new psychiatrist   Elvin So, MD 06/18/2015, 11:03 AM

## 2015-06-18 NOTE — Plan of Care (Signed)
Problem: Consults Goal: [facility] Treatment Patient Education Outcome: Progressing Patient remains with sad affect, no SI/HI at this time.

## 2015-06-18 NOTE — Progress Notes (Signed)
Patient with sad affect, cooperative behavior with meals, meds and plan of care. No SI/HI at this time. Quiet with peers, verbalizes needs appropriately with staff. Encouraged to attend therapy groups to learn coping skills. Ambulates with slow and steady gait with rolling walker. Safety maintained.

## 2015-06-18 NOTE — [facility] Group Notes (Signed)
[place] LCSW Group Therapy  06/18/2015 2:57 PM  Type of Therapy:  Group Therapy  Participation Level:  Active  Participation Quality:  Attentive  Affect:  Appropriate  Cognitive:  Alert  Insight:  Limited  Engagement in Therapy:  Limited  Modes of Intervention:  Discussion, Education, Socialization and Support  Summary of Progress/Problems: Boundaries: Patients defined boundaries and discussed the importance of them. Patients identified their own boundaries and how they feel when they are crossed. Patients discussed ways to create and/ or improve their personal boundaries. Pt attended group and stayed the entire time. Pt sat quietly and listened to other group members share.   [place] MSW, [place]  06/18/2015, 2:57 PM

## 2015-06-19 NOTE — [facility] Group Notes (Signed)
[place] LCSW Group Therapy  06/19/2015 11:04 AM  Type of Therapy:  Group Therapy  Participation Level:  Active  Participation Quality:  Attentive  Affect:  Appropriate  Cognitive:  Alert  Insight:  Limited  Engagement in Therapy:  Limited  Modes of Intervention:  Discussion, Education, Socialization and Support  Summary of Progress/Problems: Mindfulness: Patient discussed mindfulness and relaxing techniques and why they are beneficial. Pt discussed ways to incorporate mindfulness in their lives. Pt practiced a mindfulness techique and discussed how it made them feel. Pt attended group and stayed the entire time. Pt participated in activity well.   [place] MSW, [place]  06/19/2015, 11:04 AM

## 2015-06-19 NOTE — Plan of Care (Signed)
Problem: Ineffective individual coping Goal: LTG: Patient will report a decrease in negative feelings Outcome: Progressing Patient denies SI and contracts for safety.

## 2015-06-19 NOTE — Progress Notes (Signed)
Patient observed interacting with peers and staff, attended groups without any issues. During med pass patient stated that last night around 3 a.m., he woke up to use the bathroom and he felt light-headed but did not report it to staff. Patient encouraged to always reports any symptoms to nursing staff. Denies feeling light-headed at this time. Resting in bed. Encouraged to stay hydrated and not to get up from a sitting position quickly. Still using a walker to ambulate, no falls reported.

## 2015-06-19 NOTE — Progress Notes (Signed)
D: Pt denies SI/AVH. Forwards little during assessment. Pleasant and cooperative. Affect flat. CPAP in use at present time. A: Encouragement and support provided. Q15 minute checks maintained for safety. Medications given as prescribed.  R: Remains safe on unit. Voices no additional concerns.

## 2015-06-19 NOTE — Plan of Care (Signed)
Problem: Ineffective individual coping Goal: STG: Patient will remain free from self harm Outcome: Progressing Patient with a brighter affect today, seen interacting with peers and staff. Denies SI and contracts for safety.

## 2015-06-19 NOTE — Progress Notes (Signed)
Patient ID: Arthur Howe, male   DOB: [birth year], 50 y.o.   MRN: [id number]  [facility] MD Progress Note  06/19/2015 11:09 AM Arthur Howe  MRN:  [id number]  Subjective:  [name] is known to this clinician since he is seen in outpatient by this M.D. and she was responsible for his admission.  He continues to reports that he is doing much better. States his mood is better and his been sleeping and eating well. States he felt slightly dizzy last night when he got up at 3am.  States that there have been some changes made with his medications and is tolerating this change is better. He denies any suicidal thoughts. States that he did not realize he needed hospitalization but once he got here he knows that he needed to be in here. He has been interacting well with the other staff and peers and participating in groups.  Reports he was diagnosed with a urinary tract infection here in his being treated for it.  Principal Problem: Major depressive disorder, recurrent severe without psychotic features ([place]) Diagnosis:   Patient Active Problem List   Diagnosis Date Noted  . Diverticulitis [K57.92] 08/17/2014  . Acid reflux [K21.9] 10/15/2013  . Headache, migraine [G43.909] 10/15/2013  . 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 Essential hypertension [I10] 10/05/2013  . Syncope [R55] 10/05/2013  . Anxiety [F41.9] 09/14/2013  . Apnea, sleep [G47.30] 09/14/2013  . Major depressive disorder, recurrent severe without psychotic features ([name]) [F33.2] 09/14/2013  . LBP (low back pain) [M54.5] 06/12/2013  . Dupuytren's contracture of foot [M72.2] 06/12/2013  . HLD (hyperlipidemia) [E78.5] 06/12/2013   Total Time spent with patient: 20 minutes  Past Psychiatric History: Depression, anxiety, conversion disorder.  Past Medical History:  Past Medical History  Diagnosis Date  . Syncope and collapse   . Depression   . Hypertension   . Hyperlipidemia   . Plantar fascial fibromatosis    . Anxiety   . Sleep apnea   . Migraine   . Diverticulitis   . Conversion disorder     Past Surgical History  Procedure Laterality Date  . Gallbladder surgery    . Urethra surgery    . Colon surgery     Family History:  Family History  Problem Relation Age of Onset  . Arrhythmia Maternal Grandmother     heart murmur   . Hypertension Maternal Grandmother   . Hypertension Mother   . Anxiety disorder Mother   . Heart failure Father   . Bipolar disorder Paternal Uncle   . Schizophrenia Paternal Uncle    Family Psychiatric  History: See H&P Social History:  History  Alcohol Use No     History  Drug Use No    Social History   Social History  . Marital Status: Married    Spouse Name: N/A  . Number of Children: N/A  . Years of Education: N/A   Social History Main Topics  . Smoking status: Never Smoker   . Smokeless tobacco: Never Used  . Alcohol Use: No  . Drug Use: No  . Sexual Activity: Yes   Other Topics Concern  . None   Social History Narrative   Additional Social History:                         Sleep: Fair  Appetite:  Fair  Current Medications: Current Facility-Administered Medications  Medication Dose Route Frequency Provider Last Rate Last Dose  .  acetaminophen (TYLENOL) tablet 650 mg  650 mg Oral Q6H PRN Clovis Fredrickson, MD   650 mg at 06/16/15 0834  . albuterol (PROVENTIL HFA;VENTOLIN HFA) 108 (90 Base) MCG/ACT inhaler 2 puff  2 puff Inhalation Q6H PRN Jolanta B Pucilowska, MD      . alum & mag hydroxide-simeth (MAALOX/MYLANTA) 200-200-20 MG/5ML suspension 30 mL  30 mL Oral Q4H PRN Jolanta B Pucilowska, MD   30 mL at 06/16/15 1027  . buPROPion (WELLBUTRIN XL) 24 hr tablet 300 mg  300 mg Oral Daily Jolanta B Pucilowska, MD   300 mg at 06/19/15 0902  . clonazePAM (KLONOPIN) tablet 1 mg  1 mg Oral BID PRN Clovis Fredrickson, MD   1 mg at 06/13/15 2128  . fluticasone (FLONASE) 50 MCG/ACT nasal spray 2 spray  2 spray Each Nare Daily  Clovis Fredrickson, MD   2 spray at 06/19/15 0905  . fluvoxaMINE (LUVOX) tablet 200 mg  200 mg Oral QHS Clovis Fredrickson, MD   200 mg at 06/18/15 2205  . loratadine (CLARITIN) tablet 10 mg  10 mg Oral Daily Clovis Fredrickson, MD   10 mg at 06/19/15 0902  . losartan (COZAAR) tablet 100 mg  100 mg Oral Daily Jolanta B Pucilowska, MD   100 mg at 06/19/15 0902  . magnesium hydroxide (MILK OF MAGNESIA) suspension 30 mL  30 mL Oral Daily PRN Jolanta B Pucilowska, MD      . metoprolol succinate (TOPROL-XL) 24 hr tablet 25 mg  25 mg Oral Daily Jolanta B Pucilowska, MD   25 mg at 06/19/15 0902  . mometasone-formoterol (DULERA) 200-5 MCG/ACT inhaler 2 puff  2 puff Inhalation BID Clovis Fredrickson, MD   2 puff at 06/19/15 0902  . nitrofurantoin (macrocrystal-monohydrate) (MACROBID) capsule 100 mg  100 mg Oral Q12H Jolanta B Pucilowska, MD   100 mg at 06/19/15 0902  . pantoprazole (PROTONIX) EC tablet 40 mg  40 mg Oral BID AC Jolanta B Pucilowska, MD   40 mg at 06/19/15 0902  . prazosin (MINIPRESS) capsule 2 mg  2 mg Oral QHS Clovis Fredrickson, MD   2 mg at 06/18/15 2205  . risperiDONE (RISPERDAL) tablet 1 mg  1 mg Oral BID Clovis Fredrickson, MD   1 mg at 06/19/15 0902  . SUMAtriptan (IMITREX) tablet 50 mg  50 mg Oral Q2H PRN Jolanta B Pucilowska, MD      . topiramate (TOPAMAX) tablet 100 mg  100 mg Oral QHS Jolanta B Pucilowska, MD   100 mg at 06/18/15 2205  . traZODone (DESYREL) tablet 100 mg  100 mg Oral QHS Clovis Fredrickson, MD   100 mg at 06/18/15 2205    Lab Results:  No results found for this or any previous visit (from the past 70 hour(s)).  Blood Alcohol level:  No results found for: [facility]  Physical Findings: AIMS:  , ,  ,  , Dental Status Current problems with teeth and/or dentures?: No Does patient usually wear dentures?: No  CIWA:    COWS:     Musculoskeletal: Strength & Muscle Tone: decreased Gait & Station: unsteady Patient leans: N/A  Psychiatric Specialty  Exam: Review of Systems  Gastrointestinal: Positive for heartburn.  Psychiatric/Behavioral: Positive for depression and hallucinations. The patient is nervous/anxious.   All other systems reviewed and are negative.   Blood pressure 112/66, pulse 74, temperature 98.2 F (36.8 C), temperature source Oral, resp. rate 18, height 6' (1.829 m), weight 131 lb (59.421  kg), SpO2 96 %.Body mass index is 17.76 kg/(m^2).  General Appearance: Casual  Eye Contact::  Good  Speech:  Clear and Coherent  Volume:  Normal  Mood:  improved  Affect:  Appropriate  Thought Process:  Goal Directed  Orientation:  Full (Time, Place, and Person)  Thought Content:  Denies any today   Suicidal Thoughts:  Yes.  with intent/plan  Homicidal Thoughts:  No  Memory:  Immediate;   Fair Recent;   Fair Remote;   Fair  Judgement:  Fair  Insight:  Fair  Psychomotor Activity:  Normal  Concentration:  Fair  Recall:  [employer]  Language: Fair  Akathisia:  No  Handed:  Right  AIMS (if indicated):     Assets:  Communication Skills Desire for Improvement Financial Resources/Insurance Housing Intimacy Resilience Social Support Transportation  ADL's:  Intact  Cognition: WNL  Sleep:  Number of Hours: 7.3   Treatment Plan Summary: Daily contact with patient to assess and evaluate symptoms and progress in treatment and Medication management   Mr. Mcconnell is a 50 year old male with a history of depression, anxiety, and conversion disorder admitting for worsening of his symptoms and new onset auditory command hallucinations telling him self in the context of medication readjustment and severe social stressors.  1. Suicidal ideation. The patient is able to contract for safety in the hospital.  2. Mood. He has been maintained on a combination of Wellbutrin and Prozac. We started crosstaper of Prozac to Luvox to better address severe anxiety. We added Risperdal for psychosis and augmentation.  3.  Anxiety. He was maintained on Klonopin 1 mg twice daily as needed. When this was lowered the patient started experiencing severe anxiety and worsening of depression. We continue Minipress for nightmares.  4. COPD. He is on albuterol and spiriva.  5. Allergies. He is on Advertising account planner.  6. Migraine headache. He is on Topamax for headache prevention and Imitrex. We increased Topamax to 100 mg.   7. Hypertension. He is on Toprol-XL and Cozaar.  8. Insomnia. He is on trazodone.  9. Sleep apnea. His wife brought his CPAP machine.  10. Fall risk. The patient experiences weakness in lower extremities and difficulty speaking. He was ruled out for stroke by Dr. Melrose Nakayama and Dr. Allen Norris. He was told that he has conversion disorder. At home he uses a cane and a wheelchair occasionally. PT consult is greatly appreciated.   11. Metabolic syndromes screening. Triglycerides are elevated at 231, hemoglobin A1c, TSH, and prolactin are normal.   12. UTI. Urine culture positive for E. Foecalis. The patient is allergic to pennicillin. After consultation with the pharmacist, we started [place].   13. GERD. We increased Protonix to 40 mg bid.  14. Disposition. He will be discharged to home with family. He will need a follow-up appointment with a new psychiatrist   Elvin So, MD 06/19/2015, 11:09 AM

## 2015-06-20 DIAGNOSIS — G4730 Sleep apnea, unspecified: Secondary | ICD-10-CM | POA: Diagnosis present

## 2015-06-20 DIAGNOSIS — F4310 Post-traumatic stress disorder, unspecified: Secondary | ICD-10-CM | POA: Diagnosis present

## 2015-06-20 MED ORDER — FLUVOXAMINE MALEATE 100 MG PO TABS: 100.0000 mg | ORAL_TABLET | Freq: Every day | ORAL | Status: AC

## 2015-06-20 MED ORDER — CLONAZEPAM 1 MG PO TABS: 1.0000 mg | ORAL_TABLET | Freq: Two times a day (BID) | ORAL | Status: AC | PRN

## 2015-06-20 MED ORDER — RISPERIDONE 1 MG PO TABS: 2.0000 mg | ORAL_TABLET | Freq: Every day | ORAL | Status: DC

## 2015-06-20 MED ORDER — NITROFURANTOIN MONOHYD MACRO 100 MG PO CAPS: 100.0000 mg | ORAL_CAPSULE | Freq: Two times a day (BID) | ORAL | Status: DC

## 2015-06-20 MED ORDER — TRAZODONE HCL 100 MG PO TABS: 100.0000 mg | ORAL_TABLET | Freq: Every day | ORAL | Status: DC

## 2015-06-20 MED ORDER — PRAZOSIN HCL 2 MG PO CAPS: 2.0000 mg | ORAL_CAPSULE | Freq: Every day | ORAL | Status: AC

## 2015-06-20 MED ORDER — RISPERIDONE 1 MG PO TABS: 2.0000 mg | ORAL_TABLET | Freq: Every day | ORAL | Status: AC

## 2015-06-20 MED ORDER — BUPROPION HCL ER (XL) 300 MG PO TB24: 300.0000 mg | ORAL_TABLET | Freq: Every day | ORAL | Status: DC

## 2015-06-20 MED ORDER — FLUVOXAMINE MALEATE 100 MG PO TABS: 100.0000 mg | ORAL_TABLET | Freq: Every day | ORAL | Status: DC

## 2015-06-20 MED ORDER — TRAZODONE HCL 100 MG PO TABS: 100.0000 mg | ORAL_TABLET | Freq: Every day | ORAL | Status: AC

## 2015-06-20 NOTE — Tx Team (Signed)
Interdisciplinary Treatment Plan Update (Adult)  Date:  06/20/2015 Time Reviewed:  1:53 PM  Progress in Treatment: Attending groups: Yes. Participating in groups:  Yes. Taking medication as prescribed:  Yes. Tolerating medication:  Yes. Family/Significant othe contact made:  Yes, individual(s) contacted:  patient's wife Patient understands diagnosis:  Yes. Discussing patient identified problems/goals with staff:  Yes. Medical problems stabilized or resolved:  Yes. Denies suicidal/homicidal ideation: Yes. Issues/concerns per patient self-inventory:  Yes. Other:  New problem(s) identified: No, Describe:  none reported  Discharge Plan or Barriers: Patient will stabilize and discharge home with outpatient follow up scheduled.  Reason for Continuation of Hospitalization: Depression Medication stabilization Suicidal ideation  Comments:  Estimated length of stay: 0 days, will discharge today Monday 06/20/15  New goal(s):  Review of initial/current patient goals per problem list:   1.  Goal(s): Participate in aftercare plan   Met:  Yes  Target date: by discharge  As evidenced by: patient will participate in aftercare plan AEB aftercare provider and housing plan identified at discharge 06/14/15: Patient does not have a provider and will need one identified that takes his insurance by discharge.  06/16/15: Patient is referred to an outpatient provider in [place] but awaiting an appt. Patient can return home with wife once stabilized and discharged.  06/20/15: Patient will return home with wife once stabilized on medication and has an outpatient provider identified in [facility].   2.  Goal (s): Decrease depression   Met:  Yes  Target date:  As evidenced by: patient demonstrates decreased symptoms of depression and reports a Depression rating of 3 or less 06/14/15: Patient isolates in bed and not much participation in milieu appears depressed.  06/16/15: Patient reports he  is feeling much better but will continue to be monitored for symptoms of depression. 06/20/15: Patient is stable for discharge per MD.     Attendees: Patient:  Arthur Howe 4/17/20171:53 PM  Physician:  Orson Slick, MD 4/17/20171:53 PM  Nursing:   Tyler Pita, RN 4/17/20171:53 PM  Other:  Carmell Austria, LCSW 4/17/20171:53 PM  Other:  4/17/20171:53 PM  Other:  4/17/20171:53 PM  Other:  4/17/20171:53 PM  Other:  4/17/20171:53 PM  Other:  4/17/20171:53 PM  Other:  4/17/20171:53 PM  Other:   4/17/20171:53 PM   Scribe for Treatment Team:   Keene Breath, MSW, LCSW  06/20/2015, 1:53 PM
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
## 2015-06-20 NOTE — Progress Notes (Signed)
Patient denies SI/HI, denies A/V hallucinations. Patient verbalizes understanding of discharge instructions, follow up care and prescriptions.7 days medicines given to patient. Patient given all belongings from  locker. Patient escorted out by staff, transported by family. 
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
## 2015-06-20 NOTE — Progress Notes (Signed)
Recreation Therapy Notes  Date: 04.17.17 Time: 1:00 pm Location: Craft Room  Group Topic: Self-expression  Goal Area(s) Addresses:  Patient will be able to identify a color that represents each emotion. Patient will verbalize benefit of using art as a means of self-expression. Patient will verbalize one emotion experienced while participating in activity.  Behavioral Response: Attentive, Interactive  Intervention: The Colors Within Me  Activity: Patients were given a blank face worksheet and instructed to pick a color for each emotion they were experiencing and to show on the face how much of that emotion they are experiencing.  Education: LRT educated patients on other forms of self-expression.  Education Outcome: Acknowledges education/In group clarification offered  Clinical Observations/Feedback: Patient completed activity by picking a color for each emotion he was experiencing and showing on the face worksheet how much of that emotion he was experiencing. Patient contributed to group discussion by stating some of the emotions he was experiencing.  Leonette Monarch, LRT/CTRS 06/20/2015 2:31 PM

## 2015-06-20 NOTE — Progress Notes (Signed)
D: Pt affect brighter and noted to be interacting more with peers than yesterday evening. Denies SI/AVH. Denies pain. Medication compliant.  A: Encouragement and support provided. Medications given as prescribed. Q15 minute checks maintained for safety. R: Remains safe on unit. Voices no additional concerns. Will continue to monitor.

## 2015-06-21 ENCOUNTER — Telehealth (HOSPITAL_COMMUNITY): Payer: Self-pay

## 2015-06-21 NOTE — Telephone Encounter (Signed)
Telephone call from patient's wife and Mr. Urvina as they called to report a problem they stated having with Dr. Einar Grad when patient was evaluated on 06/13/15.  Collateral and then patient reported prior to patient's evaluation that date she had brought in a needed update for patient to continue to have a handicap parking pass.  Collateral reported they were called and informed Dr. Einar Grad does not fill these out and instructed patient of need to follow up with his primary care provider even though Mrs. Matthews reports Dr. Jimmye Norman always filled these out for them.  Mrs. Folino reported their problem with this was that then when they came in for patient's evaluation on 06/13/15, when patient went inpatient after evaluation, they were told by Dr. Einar Grad the forms had been completed and were at the front desk for pick up at [facility] office.  Collateral reported while patient was inpatient, his Mother went by to pick up the form and it was unsigned by Dr. Einar Grad.  They reported then calling back and again then were told she does not complete these forms and would like to know why as they report they were mislead.  Informed this nurse manager could follow up with Dr. Einar Grad to question, who is out of their outpatient office working inpatient in [place] but could not guarantee she would still be willing to complete the forms as this was left at the providers discretion.   Requested they call back on 06/22/15 if had not heard back from this provider.

## 2015-06-22 NOTE — Telephone Encounter (Signed)
Met with Dr Einar Grad to discuss patient and his wife's concern patient's forms for handicap parking permit were not completed after they stated Dr. Einar Grad reported she had done these and was leaving them for patient at their outpatient office.  Dr. Einar Grad reported she was in agreement to fill out this form and stated she thought this had been completed and apologized.  Attempted to reach CMA and reception at [facility] to inform of conversation with Dr. Einar Grad and her agreement to complete handicap only papers for patient upon her return to their office in the coming week.  Left a message for patient at the mobile phone number he requested [phone] of Dr. Josefa Half apology and requested they bring them handicap permit forms needed back to the [place] office this week to be completed upon Dr. Josefa Half return to that outpatient office in the coming week.  Requested patient or his wife, reported power of attorney by patient call back if any questions.

## 2015-06-30 ENCOUNTER — Ambulatory Visit: Payer: 59 | Admitting: Psychiatry

## 2015-08-03 ENCOUNTER — Emergency Department
Admission: EM | Admit: 2015-08-03 | Discharge: 2015-08-03 | Disposition: A | Discharge status: Home / Self Care | Payer: 59 | Attending: Emergency Medicine | Admitting: Emergency Medicine

## 2015-08-03 ENCOUNTER — Emergency Department: Payer: 59

## 2015-08-03 ENCOUNTER — Encounter: Payer: Self-pay | Admitting: Emergency Medicine

## 2015-08-03 DIAGNOSIS — I10 Essential (primary) hypertension: Secondary | ICD-10-CM | POA: Insufficient documentation

## 2015-08-03 DIAGNOSIS — F332 Major depressive disorder, recurrent severe without psychotic features: Secondary | ICD-10-CM | POA: Insufficient documentation

## 2015-08-03 DIAGNOSIS — R55 Syncope and collapse: Secondary | ICD-10-CM | POA: Diagnosis present

## 2015-08-03 DIAGNOSIS — E785 Hyperlipidemia, unspecified: Secondary | ICD-10-CM | POA: Diagnosis not present

## 2015-08-03 DIAGNOSIS — R0789 Other chest pain: Secondary | ICD-10-CM | POA: Insufficient documentation

## 2015-08-03 DIAGNOSIS — R079 Chest pain, unspecified: Secondary | ICD-10-CM

## 2015-08-03 DIAGNOSIS — Z79899 Other long term (current) drug therapy: Secondary | ICD-10-CM | POA: Insufficient documentation

## 2015-08-03 LAB — CBC
HEMATOCRIT: 44.5 % (ref 40.0–52.0)
HEMOGLOBIN: 15.3 g/dL (ref 13.0–18.0)
MCH: 31 pg (ref 26.0–34.0)
MCHC: 34.4 g/dL (ref 32.0–36.0)
MCV: 90.1 fL (ref 80.0–100.0)
Platelets: 170 10*3/uL (ref 150–440)
RBC: 4.94 MIL/uL (ref 4.40–5.90)
RDW: 13.3 % (ref 11.5–14.5)
WBC: 8.8 10*3/uL (ref 3.8–10.6)

## 2015-08-03 LAB — URINALYSIS COMPLETE WITH MICROSCOPIC (ARMC ONLY)
Bacteria, UA: NONE SEEN
Bilirubin Urine: NEGATIVE
Glucose, UA: NEGATIVE mg/dL
HGB URINE DIPSTICK: NEGATIVE
KETONES UR: NEGATIVE mg/dL
LEUKOCYTES UA: NEGATIVE
NITRITE: NEGATIVE
PROTEIN: NEGATIVE mg/dL
SPECIFIC GRAVITY, URINE: 1.016 (ref 1.005–1.030)
pH: 6 (ref 5.0–8.0)

## 2015-08-03 LAB — BASIC METABOLIC PANEL
ANION GAP: 6 (ref 5–15)
BUN: 15 mg/dL (ref 6–20)
CO2: 20 mmol/L — ABNORMAL LOW (ref 22–32)
Calcium: 8.3 mg/dL — ABNORMAL LOW (ref 8.9–10.3)
Chloride: 114 mmol/L — ABNORMAL HIGH (ref 101–111)
Creatinine, Ser: 1 mg/dL (ref 0.61–1.24)
GLUCOSE: 99 mg/dL (ref 65–99)
POTASSIUM: 3.3 mmol/L — AB (ref 3.5–5.1)
SODIUM: 140 mmol/L (ref 135–145)

## 2015-08-03 LAB — TROPONIN I

## 2015-08-03 MED ORDER — LORAZEPAM 2 MG/ML IJ SOLN
1.0000 mg | Freq: Once | INTRAMUSCULAR | Status: AC
  Administered 2015-08-03: 1 mg via INTRAVENOUS
  Filled 2015-08-03: qty 1

## 2015-08-03 NOTE — ED Provider Notes (Signed)
[facility] Emergency Department Provider Note  Time seen: 4:54 PM  I have reviewed the triage vital signs and the nursing notes.   HISTORY  Chief Complaint Loss of Consciousness and Chest Pain    HPI Arthur Howe is a 50 y.o. male with a past medical history of anxiety, hypertension, hyperlipidemia, conversion disorder occasionally causing right-sided deficits, who presents to the emergency department with chest pain and syncope. According to the patient for the past 3 days he has been experiencing occasional syncopal episodes. States Friday night he had a syncopal episode, yesterday had 2 syncopal episodes. Patient also states for the past 3 days she has been experiencing a mild chest pressure. He went to his primary care doctor today who sent him to the emergency department for further evaluation. Patient does state a history of chest pain with anxiety although it typically resolves with Klonopin, it did not resolve Klonopinpatient states a long history of syncopal episodes. Were a Holter monitor 2 years ago without event. Patient also states chest pain which is typical for his anxiety. Also states intermittent right-sided deficits which have been diagnosed as conversion disorder per patient.     Past Medical History  Diagnosis Date  . Syncope and collapse   . Depression   . Hypertension   . Hyperlipidemia   . Plantar fascial fibromatosis   . Anxiety   . Sleep apnea   . Migraine   . Diverticulitis   . Conversion disorder     Patient Active Problem List   Diagnosis Date Noted  . Sleep apnea 06/20/2015  . Post traumatic stress disorder (PTSD) 06/20/2015  . Diverticulitis 08/17/2014  . Acid reflux 10/15/2013  . Headache, migraine 10/15/2013  . Conversion disorder with abnormal movement 10/05/2013  . Essential hypertension 10/05/2013  . Syncope 10/05/2013  . Anxiety 09/14/2013  . Major depressive disorder, recurrent severe without psychotic  features ([place]) 09/14/2013  . LBP (low back pain) 06/12/2013  . Dupuytren's contracture of foot 06/12/2013  . HLD (hyperlipidemia) 06/12/2013    Past Surgical History  Procedure Laterality Date  . Gallbladder surgery    . Urethra surgery    . Colon surgery      Current Outpatient Rx  Name  Route  Sig  Dispense  Refill  . albuterol (PROVENTIL HFA;VENTOLIN HFA) 108 (90 BASE) MCG/ACT inhaler   Inhalation   Inhale 2 puffs into the lungs every 6 (six) hours as needed for wheezing or shortness of breath.          [place] Kitchen buPROPion (WELLBUTRIN XL) 300 MG 24 hr tablet   Oral   Take 1 tablet (300 mg total) by mouth daily.   30 tablet   1   . clonazePAM (KLONOPIN) 1 MG tablet   Oral   Take 1 tablet (1 mg total) by mouth 2 (two) times daily as needed (anxiety).   60 tablet   0   . cyclobenzaprine (FLEXERIL) 5 MG tablet   Oral   Take 5 mg by mouth 3 (three) times daily as needed for muscle spasms.         . fexofenadine (ALLEGRA) 180 MG tablet   Oral   Take 180 mg by mouth at bedtime.         . fluticasone (FLONASE) 50 MCG/ACT nasal spray   Each Nare   Place 2 sprays into both nostrils at bedtime.         . Fluticasone-Salmeterol (ADVAIR) 250-50 MCG/DOSE AEPB   Inhalation  Inhale 1 puff into the lungs 2 (two) times daily.         . fluvoxaMINE (LUVOX) 100 MG tablet   Oral   Take 1 tablet (100 mg total) by mouth at bedtime.   30 tablet   1   . losartan (COZAAR) 100 MG tablet   Oral   Take 100 mg by mouth at bedtime.          . metoprolol succinate (TOPROL-XL) 25 MG 24 hr tablet      TK 1 T PO QD      1   . Multiple Vitamin (MULTIVITAMIN WITH MINERALS) TABS tablet   Oral   Take 1 tablet by mouth daily.         . nitrofurantoin, macrocrystal-monohydrate, (MACROBID) 100 MG capsule   Oral   Take 1 capsule (100 mg total) by mouth every 12 (twelve) hours.   20 capsule   0   . pantoprazole (PROTONIX) 40 MG tablet   Oral   Take 40 mg by mouth at bedtime.           . prazosin (MINIPRESS) 2 MG capsule   Oral   Take 1 capsule (2 mg total) by mouth at bedtime.   30 capsule   0   . risperiDONE (RISPERDAL) 1 MG tablet   Oral   Take 2 tablets (2 mg total) by mouth at bedtime.   30 tablet   0   . SUMAtriptan (IMITREX) 50 MG tablet   Oral   Take 50 mg by mouth every 2 (two) hours as needed for migraine.       1   . topiramate (TOPAMAX) 50 MG tablet   Oral   Take 50 mg by mouth 2 (two) times daily.          . traZODone (DESYREL) 100 MG tablet   Oral   Take 1 tablet (100 mg total) by mouth at bedtime.   30 tablet   0     Allergies Amoxicillin and Penicillins  Family History  Problem Relation Age of Onset  . Arrhythmia Maternal Grandmother     heart murmur   . Hypertension Maternal Grandmother   . Hypertension Mother   . Anxiety disorder Mother   . Heart failure Father   . Bipolar disorder Paternal Uncle   . Schizophrenia Paternal Uncle     Social History Social History  Substance Use Topics  . Smoking status: Never Smoker   . Smokeless tobacco: Never Used  . Alcohol Use: No    Review of Systems Constitutional: Negative for fever.Positive for syncope. Cardiovascular: Positive for chest pain. Respiratory: Negative for shortness of breath. Gastrointestinal: Negative for abdominal pain. Genitourinary: Negative for dysuria. Neurological: Negative for headache 10-point ROS otherwise negative.  ____________________________________________   PHYSICAL EXAM:  VITAL SIGNS: ED Triage Vitals  Enc Vitals Group     BP 08/03/15 1608 132/84 mmHg     Pulse Rate 08/03/15 1608 90     Resp 08/03/15 1608 13     Temp 08/03/15 1614 98.1 F (36.7 C)     Temp Source 08/03/15 1614 Oral     SpO2 08/03/15 1608 96 %     Weight 08/03/15 1614 236 lb (107.049 kg)     Height 08/03/15 1614 6' (1.829 m)     Head Cir --      Peak Flow --      Pain Score 08/03/15 1616 7     Pain Loc --  Pain Edu? --      Excl. in [place]? --      Constitutional: Alert and oriented. Well appearing and in no distress. Eyes: Normal exam ENT   Head: Normocephalic and atraumatic   Mouth/Throat: Mucous membranes are moist. Cardiovascular: Normal rate, regular rhythm. No murmur Respiratory: Normal respiratory effort without tachypnea nor retractions. Breath sounds are clear  Gastrointestinal: Soft and nontender. No distention.  Musculoskeletal: Nontender with normal range of motion in all extremities. No lower extremity tenderness or edema. Neurologic:  Normal speech and language. No gross focal neurologic deficits Skin:  Skin is warm, dry and intact.  Psychiatric: Mood and affect are normal. Speech and behavior are normal.   ____________________________________________    EKG  EKG reviewed and interpreted by myself shows normal sinus rhythm at 89 bpm, narrow QRS, normal axis, normal intervals, no ST changes. Normal EKG.  ____________________________________________    RADIOLOGY  Chest x-ray negative  ____________________________________________   INITIAL IMPRESSION / ASSESSMENT AND PLAN / ED COURSE  Pertinent labs & imaging results that were available during my care of the patient were reviewed by me and considered in my medical decision making (see chart for details).  Patient's workup was negative including negative troponin. Normal EKG, normal chest x-ray. Patient states his pain is improved after Ativan, states he is able to sleep, is feeling better but states very slight chest pain remaining. With a normal workup including negative troponin and symptoms ongoing for greater than 3 days I believe the patient's risk for ACS to be extremely low. I discussed with the patient follow up with cardiology for a stress test. Patient is agreeable to this plan. Patient has seen Dr. Ubaldo Glassing in the past, and he will see him as a follow-up.    ____________________________________________   FINAL CLINICAL IMPRESSION(S) / ED  DIAGNOSES  Chest pain   Harvest Dark, MD 08/03/15 [phone]

## 2015-08-03 NOTE — ED Notes (Signed)
Pt in via EMS; pt reports multiple syncopal episodes since Friday night in addition to chest pain.  Pt with 2 syncopal episodes today with chest pain and dizziness prior, was seen at [facility] and sent here via EMS.  Pt states he thought the chest pain has been related to anxiety, pt reports taking klonopin but with no relief.  Pt denies hitting head.  Pt A/Ox4, vitals WDL, no immediate distress at this time.

## 2015-08-03 NOTE — Discharge Instructions (Signed)
You have been seen in the emergency department today for chest pain. Your workup has shown normal results. As we discussed please follow-up with your primary care physician in the next 1-2 days for recheck. Return to the emergency department for any further chest pain, trouble breathing, or any other symptom personally concerning to yourself. Please call the number provided for cardiology to arrange a follow-up appointment says possible to discuss further management or workup such as a stress test or Holter monitor.   Nonspecific Chest Pain It is often hard to find the cause of chest pain. There is always a chance that your pain could be related to something serious, such as a heart attack or a blood clot in your lungs. Chest pain can also be caused by conditions that are not life-threatening. If you have chest pain, it is very important to follow up with your doctor.  HOME CARE  If you were prescribed an antibiotic medicine, finish it all even if you start to feel better.  Avoid any activities that cause chest pain.  Do not use any tobacco products, including cigarettes, chewing tobacco, or electronic cigarettes. If you need help quitting, ask your doctor.  Do not drink alcohol.  Take medicines only as told by your doctor.  Keep all follow-up visits as told by your doctor. This is important. This includes any further testing if your chest pain does not go away.  Your doctor may tell you to keep your head raised (elevated) while you sleep.  Make lifestyle changes as told by your doctor. These may include:  Getting regular exercise. Ask your doctor to suggest some activities that are safe for you.  Eating a heart-healthy diet. Your doctor or a diet specialist (dietitian) can help you to learn healthy eating options.  Maintaining a healthy weight.  Managing diabetes, if necessary.  Reducing stress. GET HELP IF:  Your chest pain does not go away, even after treatment.  You have a  rash with blisters on your chest.  You have a fever. GET HELP RIGHT AWAY IF:  Your chest pain is worse.  You have an increasing cough, or you cough up blood.  You have severe belly (abdominal) pain.  You feel extremely weak.  You pass out (faint).  You have chills.  You have sudden, unexplained chest discomfort.  You have sudden, unexplained discomfort in your arms, back, neck, or jaw.  You have shortness of breath at any time.  You suddenly start to sweat, or your skin gets clammy.  You feel nauseous.  You vomit.  You suddenly feel light-headed or dizzy.  Your heart begins to beat quickly, or it feels like it is skipping beats. These symptoms may be an emergency. Do not wait to see if the symptoms will go away. Get medical help right away. Call your local emergency services (911 in the [place] Do not drive yourself to the hospital.   This information is not intended to replace advice given to you by your health care provider. Make sure you discuss any questions you have with your health care provider.   Document Released: 08/08/2007 Document Revised: 03/12/2014 Document Reviewed: 09/25/2013 Elsevier Interactive Patient Education [employer].

## 2015-08-03 NOTE — ED Notes (Signed)
Patient transported to X-ray 

## 2015-08-03 NOTE — ED Notes (Signed)

## 2015-12-17 ENCOUNTER — Emergency Department
Admission: EM | Admit: 2015-12-17 | Discharge: 2015-12-19 | Disposition: A | Discharge status: Other Acute Inpatient Hospital | Payer: 59 | Attending: Emergency Medicine | Admitting: Emergency Medicine

## 2015-12-17 DIAGNOSIS — Z791 Long term (current) use of non-steroidal anti-inflammatories (NSAID): Secondary | ICD-10-CM | POA: Insufficient documentation

## 2015-12-17 DIAGNOSIS — I10 Essential (primary) hypertension: Secondary | ICD-10-CM | POA: Diagnosis not present

## 2015-12-17 DIAGNOSIS — R440 Auditory hallucinations: Secondary | ICD-10-CM

## 2015-12-17 DIAGNOSIS — F39 Unspecified mood [affective] disorder: Secondary | ICD-10-CM | POA: Insufficient documentation

## 2015-12-17 DIAGNOSIS — Z79899 Other long term (current) drug therapy: Secondary | ICD-10-CM | POA: Insufficient documentation

## 2015-12-17 DIAGNOSIS — F332 Major depressive disorder, recurrent severe without psychotic features: Secondary | ICD-10-CM | POA: Diagnosis present

## 2015-12-17 DIAGNOSIS — F419 Anxiety disorder, unspecified: Secondary | ICD-10-CM | POA: Diagnosis present

## 2015-12-17 DIAGNOSIS — Z7951 Long term (current) use of inhaled steroids: Secondary | ICD-10-CM | POA: Insufficient documentation

## 2015-12-17 DIAGNOSIS — F4310 Post-traumatic stress disorder, unspecified: Secondary | ICD-10-CM | POA: Diagnosis present

## 2015-12-17 DIAGNOSIS — R45851 Suicidal ideations: Secondary | ICD-10-CM

## 2015-12-17 LAB — COMPREHENSIVE METABOLIC PANEL
ALK PHOS: 62 U/L (ref 38–126)
ALT: 34 U/L (ref 17–63)
AST: 24 U/L (ref 15–41)
Albumin: 4.4 g/dL (ref 3.5–5.0)
Anion gap: 5 (ref 5–15)
BUN: 14 mg/dL (ref 6–20)
CO2: 26 mmol/L (ref 22–32)
CREATININE: 1.25 mg/dL — AB (ref 0.61–1.24)
Calcium: 9.2 mg/dL (ref 8.9–10.3)
Chloride: 109 mmol/L (ref 101–111)
GFR calc Af Amer: >60 mL/min (ref >60)
GFR calc non Af Amer: >60 mL/min (ref >60)
GLUCOSE: 95 mg/dL (ref 65–99)
POTASSIUM: 4 mmol/L (ref 3.5–5.1)
SODIUM: 140 mmol/L (ref 135–145)
TOTAL PROTEIN: 7.9 g/dL (ref 6.5–8.1)
Total Bilirubin: 1.1 mg/dL (ref 0.3–1.2)

## 2015-12-17 LAB — CBC
HEMATOCRIT: 45.9 % (ref 40.0–52.0)
HEMOGLOBIN: 15.9 g/dL (ref 13.0–18.0)
MCH: 31.2 pg (ref 26.0–34.0)
MCHC: 34.6 g/dL (ref 32.0–36.0)
MCV: 90.1 fL (ref 80.0–100.0)
Platelets: 178 10*3/uL (ref 150–440)
RBC: 5.09 MIL/uL (ref 4.40–5.90)
RDW: 13.5 % (ref 11.5–14.5)
WBC: 8.9 10*3/uL (ref 3.8–10.6)

## 2015-12-17 LAB — URINE DRUG SCREEN, QUALITATIVE (ARMC ONLY)
Amphetamines, Ur Screen: NOT DETECTED
Barbiturates, Ur Screen: NOT DETECTED
Benzodiazepine, Ur Scrn: NOT DETECTED
CANNABINOID 50 NG, UR ~~LOC~~: NOT DETECTED
COCAINE METABOLITE, UR ~~LOC~~: NOT DETECTED
MDMA (ECSTASY) UR SCREEN: NOT DETECTED
Methadone Scn, Ur: NOT DETECTED
Opiate, Ur Screen: NOT DETECTED
Phencyclidine (PCP) Ur S: NOT DETECTED
Tricyclic, Ur Screen: NOT DETECTED

## 2015-12-17 LAB — SALICYLATE LEVEL: Salicylate Lvl: <7 mg/dL (ref 2.8–30.0)

## 2015-12-17 LAB — ACETAMINOPHEN LEVEL: Acetaminophen (Tylenol), Serum: <10 ug/mL — ABNORMAL LOW (ref 10–30)

## 2015-12-17 LAB — ETHANOL: Alcohol, Ethyl (B): <5 mg/dL (ref <5)

## 2015-12-17 MED ORDER — BUPROPION HCL ER (XL) 300 MG PO TB24
300.0000 mg | ORAL_TABLET | Freq: Every day | ORAL | Status: DC
  Administered 2015-12-17 – 2015-12-19 (×3): 300 mg via ORAL
  Filled 2015-12-17 (×4): qty 1

## 2015-12-17 NOTE — ED Notes (Signed)
Arthur Howe [phone] cell phone  Please call with questions, update or discharge.

## 2015-12-17 NOTE — BH Assessment (Addendum)
Tele Assessment Note   Patient is a 50 year old white male that reports suicidal ideation with a plan to have a police officer shoot him.   Patient also reports having a plan to overdose on his psychiatric medication because he feels that it would be easier to overdose then to have a police officer kill him.   Patient reports hearing voices telling him that he would be better off dead.  Patient report that he recently had his psychiatric medication (Wellbutrin) decreased due to seizure like activity.  Patient reports that 3 weeks ago he had his dose of Wellbutrin decreased from 300 to 150 in anticipation of starting a new medication called Activis.  Patient reports that since taking this new medication he has experienced an increase of hearing voices telling her to kill himself.   Patient reports a prior inpatient psychiatric hospitalization in April 2017 at [facility].  Patient denies HI and Substance Abuse.   Patient reports that he has been married for 21 years.  Patient reports that he lives with his wife and a 20 year old child. He has 2 biological children and 2 stepchildren in all.  Patient reports that his wife supports while he is attempting to obtain disability.  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  Patient reports that he has been out of work for the past 3 years due to his "stroke like symptoms ".    Patient denies HI and Substance Abuse.     Diagnosis: Mood Disorder   Past Medical History:  Past Medical History:  Diagnosis Date  . Anxiety   . Conversion disorder   . Depression   . Diverticulitis   . Hyperlipidemia   . Hypertension   . Migraine   . Plantar fascial fibromatosis   . Sleep apnea   . Syncope and collapse     Past Surgical History:  Procedure Laterality Date  . COLON SURGERY    . GALLBLADDER SURGERY    . URETHRA SURGERY      Family History:  Family History  Problem Relation Age of Onset  . Arrhythmia Maternal  Grandmother     heart murmur   . Hypertension Maternal Grandmother   . Hypertension Mother   . Anxiety disorder Mother   . Heart failure Father   . Bipolar disorder Paternal Uncle   . Schizophrenia Paternal Uncle     Social History:  reports that he has never smoked. He has never used smokeless tobacco. He reports that he does not drink alcohol or use drugs.  Additional Social History:  Alcohol / Drug Use History of alcohol / drug use?: No history of alcohol / drug abuse  CIWA: CIWA-Ar BP: 122/82 Pulse Rate: 64 COWS:    PATIENT STRENGTHS: (choose at least two) Average or above average intelligence Capable of independent living Communication skills Physical Health Supportive family/friends  Allergies:  Allergies  Allergen Reactions  . Amoxicillin Anaphylaxis and Other (See Comments)    Has patient had a PCN reaction causing immediate rash, facial/tongue/throat swelling, SOB or lightheadedness with hypotension: Yes Has patient had a PCN reaction causing severe rash involving mucus membranes or skin necrosis: No Has patient had a PCN reaction that required hospitalization No Has patient had a PCN reaction occurring within the last 10 years: No If all of the above answers are "NO", then may proceed with Cephalosporin use.  Marland Kitchen Penicillins Anaphylaxis and Other (See Comments)    Has patient had a PCN  reaction causing immediate rash, facial/tongue/throat swelling, SOB or lightheadedness with hypotension: Yes Has patient had a PCN reaction causing severe rash involving mucus membranes or skin necrosis: No Has patient had a PCN reaction that required hospitalization No Has patient had a PCN reaction occurring within the last 10 years: No If all of the above answers are "NO", then may proceed with Cephalosporin use.    Home Medications:  (Not in a hospital admission)  OB/GYN Status:  No LMP for male patient.  General Assessment Data Location of Assessment: [facility] ED TTS  Assessment: In system Is this a Tele or Face-to-Face Assessment?: Tele Assessment Is this an Initial Assessment or a Re-assessment for this encounter?: Initial Assessment Marital status: Married [place] name: NA Is patient pregnant?: No Pregnancy Status: No Living Arrangements: Spouse/significant other Can pt return to current living arrangement?: Yes Admission Status: Voluntary Is patient capable of signing voluntary admission?: Yes Referral Source: Self/Family/Friend Insurance type: [facility]  Medical Screening Exam ([place]) Medical Exam completed:  (NA)  Crisis Care Plan Living Arrangements: Spouse/significant other Legal Guardian:  (NA) Name of Psychiatrist: Dr. Josph Macho Name of Therapist: NA  Education Status Is patient currently in school?: No Current Grade: NA Highest grade of school patient has completed: NA Name of school: NA Contact person: NA  Risk to self with the past 6 months Suicidal Ideation: Yes-Currently Present Has patient been a risk to self within the past 6 months prior to admission? : Yes Suicidal Intent: Yes-Currently Present Has patient had any suicidal intent within the past 6 months prior to admission? : No Is patient at risk for suicide?: Yes Suicidal Plan?: Yes-Currently Present Has patient had any suicidal plan within the past 6 months prior to admission? : Yes Specify Current Suicidal Plan: Overdose on psychiatric pills Access to Means: Yes Specify Access to Suicidal Means: Pills What has been your use of drugs/alcohol within the last 12 months?: None Reported Previous Attempts/Gestures: Yes How many times?: 1 Other Self Harm Risks: None Reported Triggers for Past Attempts: Other (Comment) (His fiance left him and he cut his wrist in 1998.) Intentional Self Injurious Behavior: None Family Suicide History: No Recent stressful life event(s): Job Loss, Financial Problems (Attempting to obtain disability.) Persecutory voices/beliefs?:  Yes Depression: Yes Depression Symptoms: Despondent, Tearfulness, Isolating, Fatigue, Guilt, Loss of interest in usual pleasures, Feeling worthless/self pity Substance abuse history and/or treatment for substance abuse?: No Suicide prevention information given to non-admitted patients: Yes  Risk to Others within the past 6 months Homicidal Ideation: No Does patient have any lifetime risk of violence toward others beyond the six months prior to admission? : No Thoughts of Harm to Others: No Current Homicidal Intent: No Current Homicidal Plan: No Access to Homicidal Means: No Identified Victim: NA History of harm to others?: No Assessment of Violence: None Noted Violent Behavior Description: None Reported Does patient have access to weapons?: No Criminal Charges Pending?: No Does patient have a court date: No Is patient on probation?: No  Psychosis Hallucinations: Auditory Delusions: None noted  Mental Status Report Appearance/Hygiene: In scrubs Eye Contact: Fair Motor Activity: Freedom of movement, Restlessness Speech: Logical/coherent Level of Consciousness: Alert, Quiet/awake Mood: Depressed, Anxious, Suspicious, Worthless, low self-esteem Affect: Depressed Anxiety Level: Minimal Thought Processes: Coherent, Relevant Judgement: Impaired Orientation: Person, Place, Time, Situation Obsessive Compulsive Thoughts/Behaviors: None  Cognitive Functioning Concentration: Decreased Memory: Recent Intact, Remote Intact IQ: Average Insight: Fair Impulse Control: Fair Appetite: Fair Weight Loss: 0 Weight Gain: 0 Sleep: Decreased Total Hours  of Sleep: 5 Vegetative Symptoms: Staying in bed  ADLScreening [facility] Assessment Services) Patient's cognitive ability adequate to safely complete daily activities?: No Patient able to express need for assistance with ADLs?: No Independently performs ADLs?: Yes (appropriate for developmental age)  Prior Inpatient Therapy Prior Inpatient  Therapy: Yes Prior Therapy Dates: 2017 Prior Therapy Facilty/Provider(s): [facility] Reason for Treatment: Psychoisi and SI  Prior Outpatient Therapy Prior Outpatient Therapy: Yes Prior Therapy Dates: Ongoing  Prior Therapy Facilty/Provider(s): Dr. Josph Macho Reason for Treatment: Si and Psychosis Does patient have an ACCT team?: No Does patient have Intensive In-House Services?  : No Does patient have [facility] services? : No Does patient have P4CC services?: No  ADL Screening (condition at time of admission) Patient's cognitive ability adequate to safely complete daily activities?: No Is the patient deaf or have difficulty hearing?: No Does the patient have difficulty seeing, even when wearing glasses/contacts?: No Does the patient have difficulty concentrating, remembering, or making decisions?: No Patient able to express need for assistance with ADLs?: No Does the patient have difficulty dressing or bathing?: No Independently performs ADLs?: Yes (appropriate for developmental age) Does the patient have difficulty walking or climbing stairs?: No Weakness of Legs: None Weakness of Arms/Hands: None  Home Assistive Devices/Equipment Home Assistive Devices/Equipment: None    Abuse/Neglect Assessment (Assessment to be complete while patient is alone) Physical Abuse: Denies Verbal Abuse: Denies Sexual Abuse: Denies Exploitation of patient/patient's resources: Denies Self-Neglect: Denies Values / Beliefs Cultural Requests During Hospitalization: None Spiritual Requests During Hospitalization: None Consults Spiritual Care Consult Needed: No Social Work Consult Needed: No Regulatory affairs officer (For Healthcare) Does patient have an advance directive?: No Would patient like information on creating an advanced directive?: No - patient declined information    Additional Information 1:1 In Past 12 Months?: No CIRT Risk: No Elopement Risk: No Does patient have medical clearance?: Yes      Disposition: Per Lindon Romp, NP - patient meets criteria for inpatient hospitalization.  Disposition Initial Assessment Completed for this Encounter: Yes  Graciella Freer [place] 12/17/2015 8:11 PM

## 2015-12-17 NOTE — ED Notes (Signed)
Report called to Maudie Mercury, RN  In ED BHU.

## 2015-12-17 NOTE — ED Provider Notes (Signed)
[facility] Emergency Department Provider Note  ____________________________________________  Time seen: Approximately 4:56 PM  I have reviewed the triage vital signs and the nursing notes.   HISTORY  Chief Complaint Suicidal   HPI Arthur Howe is a 50 y.o. male with a history of conversion disorder, PTSD, anxiety who presents for evaluation of auditory hallucinations. Patient reports that 3 weeks ago he had his dose of Wellbutrin decreased from 300 to 150 in anticipation of starting a new medication called Activis. He started a new medication a week ago. He reports that for the last 3 weeks has been having auditory hallucinations and he was hoping they would get better with a new medicine but they actually got worse. The voices have been telling him to kill himself. Patient reports that he has a very shrunk face and would never do anything like it but is very disturbed about the voices. Today he presents with his wife requesting help. Patient denies having a plan. He has had prior psychiatric hospitalizations. He denies drug use or alcohol use.  Past Medical History:  Diagnosis Date  . Anxiety   . Conversion disorder   . Depression   . Diverticulitis   . Hyperlipidemia   . Hypertension   . Migraine   . Plantar fascial fibromatosis   . Sleep apnea   . Syncope and collapse     Patient Active Problem List   Diagnosis Date Noted  . Sleep apnea 06/20/2015  . Post traumatic stress disorder (PTSD) 06/20/2015  . Diverticulitis 08/17/2014  . Acid reflux 10/15/2013  . Headache, migraine 10/15/2013  . Conversion disorder with abnormal movement 10/05/2013  . Essential hypertension 10/05/2013  . Syncope 10/05/2013  . Anxiety 09/14/2013  . Major depressive disorder, recurrent severe without psychotic features ([place]) 09/14/2013  . LBP (low back pain) 06/12/2013  . Dupuytren's contracture of foot 06/12/2013  . HLD (hyperlipidemia) 06/12/2013    Past  Surgical History:  Procedure Laterality Date  . COLON SURGERY    . GALLBLADDER SURGERY    . URETHRA SURGERY      Prior to Admission medications   Medication Sig Start Date End Date Taking? Authorizing Provider  albuterol (PROVENTIL HFA;VENTOLIN HFA) 108 (90 BASE) MCG/ACT inhaler Inhale 2 puffs into the lungs every 6 (six) hours as needed for wheezing or shortness of breath.    Yes Historical Provider, MD  buPROPion (WELLBUTRIN XL) 150 MG 24 hr tablet Take 150 mg by mouth daily.   Yes Historical Provider, MD  clonazePAM (KLONOPIN) 1 MG tablet Take 1 tablet (1 mg total) by mouth 2 (two) times daily as needed (anxiety). 06/20/15  Yes Jolanta B Pucilowska, MD  cyclobenzaprine (FLEXERIL) 5 MG tablet Take 5 mg by mouth 3 (three) times daily as needed for muscle spasms.   Yes Historical Provider, MD  fluticasone-salmeterol (ADVAIR HFA) 115-21 MCG/ACT inhaler Inhale 2 puffs into the lungs 2 (two) times daily.   Yes Historical Provider, MD  fluvoxaMINE (LUVOX) 100 MG tablet Take 1 tablet (100 mg total) by mouth at bedtime. 06/20/15  Yes Jolanta B Pucilowska, MD  guanFACINE (TENEX) 1 MG tablet Take 1 mg by mouth daily. 12/06/15  Yes Historical Provider, MD  losartan (COZAAR) 100 MG tablet Take 100 mg by mouth at bedtime.    Yes Historical Provider, MD  metoprolol succinate (TOPROL-XL) 25 MG 24 hr tablet Take 25 mg by mouth daily.   Yes Historical Provider, MD  pantoprazole (PROTONIX) 40 MG tablet Take 40 mg by  mouth daily.    Yes Historical Provider, MD  prazosin (MINIPRESS) 2 MG capsule Take 1 capsule (2 mg total) by mouth at bedtime. 06/20/15  Yes Jolanta B Pucilowska, MD  risperiDONE (RISPERDAL) 1 MG tablet Take 2 tablets (2 mg total) by mouth at bedtime. 06/20/15  Yes Jolanta B Pucilowska, MD  SUMAtriptan (IMITREX) 50 MG tablet Take 50 mg by mouth every 2 (two) hours as needed for migraine.    Yes Historical Provider, MD  topiramate (TOPAMAX) 50 MG tablet Take 50 mg by mouth 2 (two) times daily.    Yes  Historical Provider, MD  traZODone (DESYREL) 100 MG tablet Take 1 tablet (100 mg total) by mouth at bedtime. 06/20/15  Yes Clovis Fredrickson, MD    Allergies Amoxicillin and Penicillins  Family History  Problem Relation Age of Onset  . Arrhythmia Maternal Grandmother     heart murmur   . Hypertension Maternal Grandmother   . Hypertension Mother   . Anxiety disorder Mother   . Heart failure Father   . Bipolar disorder Paternal Uncle   . Schizophrenia Paternal Uncle     Social History Social History  Substance Use Topics  . Smoking status: Never Smoker  . Smokeless tobacco: Never Used  . Alcohol use No    Review of Systems Constitutional: Negative for fever. Eyes: Negative for visual changes. ENT: Negative for sore throat. Cardiovascular: Negative for chest pain. Respiratory: Negative for shortness of breath. Gastrointestinal: Negative for abdominal pain, vomiting or diarrhea. Genitourinary: Negative for dysuria. Musculoskeletal: Negative for back pain. Skin: Negative for rash. Neurological: Negative for headaches, weakness or numbness. Psych: + command auditory hallucinations. Denies SI or HI  ____________________________________________   PHYSICAL EXAM:  VITAL SIGNS: ED Triage Vitals [12/17/15 1442]  Enc Vitals Group     BP 127/80     Pulse Rate 66     Resp 20     Temp 97.8 F (36.6 C)     Temp Source Oral     SpO2 98 %     Weight 242 lb (109.8 kg)     Height 6' (1.829 m)     Head Circumference      Peak Flow      Pain Score      Pain Loc      Pain Edu?      Excl. in [place]?     Constitutional: Alert and oriented. Well appearing and in no apparent distress. HEENT:      Head: Normocephalic and atraumatic.         Eyes: Conjunctivae are normal. Sclera is non-icteric. EOMI. PERRL      Mouth/Throat: Mucous membranes are moist.       Neck: Supple with no signs of meningismus. Cardiovascular: Regular rate and rhythm. No murmurs, gallops, or rubs. 2+  symmetrical distal pulses are present in all extremities. No JVD. Respiratory: Normal respiratory effort. Lungs are clear to auscultation bilaterally. No wheezes, crackles, or rhonchi.  Gastrointestinal: Soft, non tender, and non distended with positive bowel sounds. No rebound or guarding. Genitourinary: No CVA tenderness. Musculoskeletal: Nontender with normal range of motion in all extremities. No edema, cyanosis, or erythema of extremities. Neurologic: Normal speech and language. Face is symmetric. Moving all extremities. No gross focal neurologic deficits are appreciated. Skin: Skin is warm, dry and intact. No rash noted. Psychiatric: Mood and affect are normal. Speech and behavior are normal. Command auditory hallucinations. Denies SI or HI. Good insight  ____________________________________________   LABS (all labs  ordered are listed, but only abnormal results are displayed)  Labs Reviewed  COMPREHENSIVE METABOLIC PANEL - Abnormal; Notable for the following:       Result Value   Creatinine, Ser 1.25 (*)    All other components within normal limits  ACETAMINOPHEN LEVEL - Abnormal; Notable for the following:    Acetaminophen (Tylenol), Serum <10 (*)    All other components within normal limits  ETHANOL  SALICYLATE LEVEL  CBC  URINE DRUG SCREEN, QUALITATIVE ([facility] ONLY)   ____________________________________________  EKG  none ____________________________________________  RADIOLOGY  none  ____________________________________________   PROCEDURES  Procedure(s) performed: None Procedures Critical Care performed:  None ____________________________________________   INITIAL IMPRESSION / ASSESSMENT AND PLAN / ED COURSE  50 y.o. male with a history of conversion disorder, PTSD, anxiety who presents for evaluation of command auditory hallucinations. I am worried about patient's safety. Patient is currently here voluntarily however will take IVC paperwork and consult [facility]  psychiatry. Labs WNL.  Clinical Course  Comment By Time  I spoke with patient's wife who tells me that she was worried about her husband's safety. She reports the patient made to suicidal comments over the course of the last few days. One on Thursday and one last night. He told her that he was planning on doing something to have the police come over to the house and kill him. She is worried because he has never said anything like this before. I have discussed this information with [facility] psychiatrist Dr. Roosevelt Locks and we decided to re-start patient on Wellbutrin 300mg  which was the dose that patient seemed to be stable on and continue to watch him under IVC.  Rudene Re, MD 10/14 [phone]    Pertinent labs & imaging results that were available during my care of the patient were reviewed by me and considered in my medical decision making (see chart for details).    ____________________________________________   FINAL CLINICAL IMPRESSION(S) / ED DIAGNOSES  Final diagnoses:  Auditory hallucinations  Suicidal ideation      NEW MEDICATIONS STARTED DURING THIS VISIT:  New Prescriptions   No medications on file     Note:  This document was prepared using Dragon voice recognition software and may include unintentional dictation errors.    Rudene Re, MD 12/18/15 [phone]

## 2015-12-17 NOTE — ED Notes (Signed)
Computer in room for telepsych consult

## 2015-12-17 NOTE — ED Notes (Signed)
Pt. Noted in room. No complaints or concerns voiced. No distress or abnormal behavior noted. Will continue to monitor with security cameras. Q 15 minute rounds continue. 

## 2015-12-17 NOTE — ED Notes (Signed)

## 2015-12-17 NOTE — ED Triage Notes (Signed)
Pt reports last week his Wellbutrin was decreased and pt was started on Actavis last week. Pt reports hearing voices telling him to kill him self and telling him that he would be better off dead. States his plan  would have a Engineer, structural shoot him. Pt in today with his wife.

## 2015-12-17 NOTE — ED Notes (Signed)

## 2015-12-17 NOTE — ED Notes (Signed)
ED [facility] [place] Is the patient under IVC or is there intent for IVC: Yes.   Is the patient medically cleared: Yes.   Is there vacancy in the ED BHU: Yes.   Is the population mix appropriate for patient: Yes.   Is the patient awaiting placement in inpatient or outpatient setting: No. Has the patient had a psychiatric consult: Yes.   Survey of unit performed for contraband, proper placement and condition of furniture, tampering with fixtures in bathroom, shower, and each patient room: Yes.  ; Findings: NA APPEARANCE/BEHAVIOR calm, cooperative and adequate rapport can be established NEURO ASSESSMENT Orientation: time, place and person Hallucinations: Yes.  Auditory Hallucinations Speech: Normal Gait: normal RESPIRATORY ASSESSMENT Normal expansion.  Clear to auscultation.  No rales, rhonchi, or wheezing. CARDIOVASCULAR ASSESSMENT regular rate and rhythm, S1, S2 normal, no murmur, click, rub or gallop GASTROINTESTINAL ASSESSMENT soft, nontender, BS WNL, no r/g  EXTREMITIES normal strength, tone, and muscle mass PLAN OF CARE Provide calm/safe environment. Vital signs assessed twice daily. ED BHU Assessment once each 12-hour shift. Collaborate with intake RN daily or as condition indicates. Assure the ED provider has rounded once each shift. Provide and encourage hygiene. Provide redirection as needed. Assess for escalating behavior; address immediately and inform ED provider.  Assess family dynamic and appropriateness for visitation as needed: Yes.  ; If necessary, describe findings: NA Educate the patient/family about BHU procedures/visitation: Yes.  ; If necessary, describe findings: NA

## 2015-12-17 NOTE — ED Notes (Signed)
Pt. To BHU from ED ambulatory without difficulty, to room BHU 3. Report from [facility]. Pt. Is alert and oriented, warm and dry in no distress. Pt. Denies SI, HI, and AVH. Pt. Calm and cooperative. Pt. Made aware of security cameras and Q15 minute rounds. Pt. Encouraged to let Nursing staff know of any concerns or needs.

## 2015-12-17 NOTE — ED Notes (Signed)
Pt. Noted in day room. No complaints or concerns voiced. No distress or abnormal behavior noted. Will continue to monitor with security cameras. Q 15 minute rounds continue. 

## 2015-12-18 MED ORDER — PANTOPRAZOLE SODIUM 40 MG PO TBEC
40.0000 mg | DELAYED_RELEASE_TABLET | Freq: Every day | ORAL | Status: DC
  Administered 2015-12-18 – 2015-12-19 (×2): 40 mg via ORAL
  Filled 2015-12-18 (×2): qty 1

## 2015-12-18 MED ORDER — BUPROPION HCL ER (XL) 150 MG PO TB24
150.0000 mg | ORAL_TABLET | Freq: Every day | ORAL | Status: DC
  Administered 2015-12-18 – 2015-12-19 (×2): 150 mg via ORAL
  Filled 2015-12-18 (×3): qty 1

## 2015-12-18 MED ORDER — RISPERIDONE 1 MG PO TABS
2.0000 mg | ORAL_TABLET | Freq: Every day | ORAL | Status: DC
  Administered 2015-12-18: 2 mg via ORAL
  Filled 2015-12-18: qty 2

## 2015-12-18 MED ORDER — FLUVOXAMINE MALEATE 50 MG PO TABS
100.0000 mg | ORAL_TABLET | Freq: Every day | ORAL | Status: DC
  Administered 2015-12-18: 100 mg via ORAL
  Filled 2015-12-18: qty 1
  Filled 2015-12-18: qty 2
  Filled 2015-12-18: qty 1

## 2015-12-18 MED ORDER — CLONAZEPAM 1 MG PO TABS: 1.0000 mg | ORAL_TABLET | Freq: Two times a day (BID) | ORAL | Status: DC | PRN

## 2015-12-18 MED ORDER — TRAZODONE HCL 100 MG PO TABS
100.0000 mg | ORAL_TABLET | Freq: Every day | ORAL | Status: DC
  Administered 2015-12-18: 100 mg via ORAL
  Filled 2015-12-18: qty 1

## 2015-12-18 MED ORDER — MOMETASONE FURO-FORMOTEROL FUM 200-5 MCG/ACT IN AERO
2.0000 | INHALATION_SPRAY | Freq: Two times a day (BID) | RESPIRATORY_TRACT | Status: DC
  Administered 2015-12-18 – 2015-12-19 (×3): 2 via RESPIRATORY_TRACT
  Filled 2015-12-18: qty 8.8

## 2015-12-18 MED ORDER — METOPROLOL SUCCINATE ER 50 MG PO TB24
25.0000 mg | ORAL_TABLET | Freq: Every day | ORAL | Status: DC
  Administered 2015-12-18 – 2015-12-19 (×2): 25 mg via ORAL
  Filled 2015-12-18 (×2): qty 1

## 2015-12-18 MED ORDER — TOPIRAMATE 100 MG PO TABS
50.0000 mg | ORAL_TABLET | Freq: Two times a day (BID) | ORAL | Status: DC
  Administered 2015-12-18 – 2015-12-19 (×3): 50 mg via ORAL
  Filled 2015-12-18 (×3): qty 1

## 2015-12-18 MED ORDER — LOSARTAN POTASSIUM 50 MG PO TABS
100.0000 mg | ORAL_TABLET | Freq: Every day | ORAL | Status: DC
  Administered 2015-12-18: 100 mg via ORAL
  Filled 2015-12-18: qty 2

## 2015-12-18 MED ORDER — BUPROPION HCL ER (XL) 150 MG PO TB24
ORAL_TABLET | ORAL | Status: AC
  Administered 2015-12-18: 300 mg via ORAL
  Filled 2015-12-18: qty 1

## 2015-12-18 MED ORDER — ALBUTEROL SULFATE HFA 108 (90 BASE) MCG/ACT IN AERS: 2.0000 | INHALATION_SPRAY | Freq: Four times a day (QID) | RESPIRATORY_TRACT | Status: DC | PRN

## 2015-12-18 MED ORDER — CYCLOBENZAPRINE HCL 10 MG PO TABS: 5.0000 mg | ORAL_TABLET | Freq: Three times a day (TID) | ORAL | Status: DC | PRN

## 2015-12-18 MED ORDER — PRAZOSIN HCL 2 MG PO CAPS
2.0000 mg | ORAL_CAPSULE | Freq: Every day | ORAL | Status: DC
  Administered 2015-12-18: 2 mg via ORAL
  Filled 2015-12-18: qty 1

## 2015-12-18 NOTE — ED Notes (Signed)
Patient out in common area watching television. In no apparent distress. Maintained on 15 minute checks and observation by security camera for safety.

## 2015-12-18 NOTE — BH Assessment (Signed)
Writer faxed referrals to the following inpatient psychiatric facilities:  [facility] [place]

## 2015-12-18 NOTE — ED Notes (Signed)
Pt. Noted in room. No complaints or concerns voiced. No distress or abnormal behavior noted. Will continue to monitor with security cameras. Q 15 minute rounds continue. 

## 2015-12-18 NOTE — ED Notes (Signed)

## 2015-12-18 NOTE — ED Notes (Signed)
Patient resting quietly in room. No noted distress or abnormal behaviors noted. Will continue 15 minute checks and observation by security camera for safety. 

## 2015-12-18 NOTE — BH Assessment (Signed)
Patient has been accepted to [facility].  Accepting physician is Dr. Carma Lair.  Call report to [phone].  Representative was [place].  ER Staff is aware of it Edd Arbour, ER Sect.; Dr. Kerman Passey, ER MD & Amy H., Patient's Nurse)      Patient bed will be available tomorrow (12/19/2015) after 9am.

## 2015-12-18 NOTE — ED Notes (Signed)
Maintained on 15 minute checks and observation by security camera for safety. 

## 2015-12-18 NOTE — ED Notes (Signed)
Patient cooperative with all nursing interventions. He reports he has some mild "bad thoughts" but would never act upon them. Maintained on 15 minute checks and observation by security camera for safety.

## 2015-12-18 NOTE — BH Assessment (Signed)
Per Lindon Romp, NP - patient meets criteria for inpatient hospitalization.  Writer will seek placement.  Per Arville Go) [place] [facility] no appripriate beds. TTS will seek placement.

## 2015-12-18 NOTE — ED Notes (Signed)

## 2015-12-18 NOTE — BH Assessment (Signed)
Referral information for Psychiatric Hospitalization faxed to;    [facility] [phone]) [phone] ext. [place] [phone]),    [facility] [phone] or [phone]),    [facility] [phone]),    [place] [phone]),    [facility] [phone]),    [facility] [phone])   [facility])

## 2015-12-18 NOTE — ED Notes (Signed)
Patient visited with wife. Visit went well. Maintained on 15 minute checks and observation by security camera for safety.

## 2015-12-18 NOTE — ED Notes (Signed)
Pt. Alert and oriented, warm and dry, in no distress. Pt. Denies SI, HI, and AVH. Pt. Encouraged to let nursing staff know of any concerns or needs. 

## 2015-12-18 NOTE — ED Notes (Signed)
IVC/ SOC recommended inpatient placement

## 2015-12-18 NOTE — ED Notes (Signed)
Patient received lunch tray and beverage.

## 2015-12-18 NOTE — ED Notes (Signed)
Patient is IVC/Accepted to [facility] and bed will be available on 12/19/15 after 9 am.

## 2015-12-18 NOTE — ED Notes (Signed)
Patient in common area watching television. Maintained on 15 minute checks and observation by security camera for safety.

## 2015-12-19 DIAGNOSIS — F332 Major depressive disorder, recurrent severe without psychotic features: Secondary | ICD-10-CM | POA: Diagnosis not present

## 2015-12-19 NOTE — ED Notes (Signed)
Pt. Noted in room. No complaints or concerns voiced. No distress or abnormal behavior noted. Will continue to monitor with security cameras. Q 15 minute rounds continue. 

## 2015-12-19 NOTE — ED Provider Notes (Signed)
-----------------------------------------   6:59 AM on 12/19/2015 -----------------------------------------   Blood pressure 117/83, pulse 70, temperature 98.5 F (36.9 C), temperature source Oral, resp. rate 18, height 6' (1.829 m), weight 242 lb (109.8 kg), SpO2 96 %.  The patient had no acute events since last update.  Calm and cooperative at this time.  Disposition is pending Psychiatry/Behavioral Medicine team recommendations.     Paulette Blanch, MD 12/19/15 [phone]

## 2015-12-19 NOTE — ED Provider Notes (Signed)
-----------------------------------------   8:27 AM on 12/19/2015 -----------------------------------------   Blood pressure 117/83, pulse 70, temperature 98.5 F (36.9 C), temperature source Oral, resp. rate 18, height 6' (1.829 m), weight 242 lb (109.8 kg), SpO2 96 %.  The patient had no acute events since last update.  Calm and cooperative at this time.  Patient is accepted to [facility] for inpatient psychiatric care. Patient is medically stable and suitable for nonmedical transport.    Carrie Mew, MD 12/19/15 [phone]

## 2015-12-19 NOTE — Consult Note (Signed)
[place] Psychiatry Consult   Reason for Consult:  Consult for 50 year old man with history of multiple mental health issues Referring Physician:  Paduchowski Patient Identification: Arthur Howe MRN:  [id number] Principal Diagnosis: Major depressive disorder, recurrent severe without psychotic features [facility]) Diagnosis:   Patient Active Problem List   Diagnosis Date Noted  . Sleep apnea [G47.30] 06/20/2015  . Post traumatic stress disorder (PTSD) [F43.10] 06/20/2015  . Diverticulitis [K57.92] 08/17/2014  . Acid reflux [K21.9] 10/15/2013  . Headache, migraine [G43.909] 10/15/2013  . 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 Essential hypertension [I10] 10/05/2013  . Syncope [R55] 10/05/2013  . Anxiety [F41.9] 09/14/2013  . Major depressive disorder, recurrent severe without psychotic features ([place]) [F33.2] 09/14/2013  . LBP (low back pain) [M54.5] 06/12/2013  . Dupuytren's contracture of foot [M72.2] 06/12/2013  . HLD (hyperlipidemia) [E78.5] 06/12/2013    Total Time spent with patient: 45 minutes  Subjective:   Arthur Howe is a 50 y.o. male patient admitted with "I had a pill taken away".  HPI:  50 year old man with history of mental health problems was been in the emergency room over the weekend. Patient initially presented with suicidal ideation with multiple plans stated. Hearing voices. Mood depressed. He was put back on his Wellbutrin over the weekend and says he is already feeling better. On evaluation today the patient says his mood is much better. He says he is not having any active suicidal thoughts and is no longer hearing voices although he was hearing them as recently as yesterday. He feels that this change in symptoms had to do with a change in medicine that his outpatient doctor took him off Wellbutrin and started him on guanfacine. Denies any other specific stressors. Denies alcohol or drug abuse.  Substance abuse history: Denies any  substance abuse issues.  Social history: Lives at home with his wife.  Medical issues: Multiple medical problems including high blood pressure dyslipidemia history of migraines  Past Psychiatric History: Mental health issues variously diagnosed as PTSD and major depression. One prior suicide attempt. Has had prior psychiatric admissions in the past.  Risk to Self: Suicidal Ideation: Yes-Currently Present Suicidal Intent: Yes-Currently Present Is patient at risk for suicide?: Yes Suicidal Plan?: Yes-Currently Present Specify Current Suicidal Plan: Overdose on psychiatric pills Access to Means: Yes Specify Access to Suicidal Means: Pills What has been your use of drugs/alcohol within the last 12 months?: None Reported How many times?: 1 Other Self Harm Risks: None Reported Triggers for Past Attempts: Other (Comment) (His fiance left him and he cut his wrist in 1998.) Intentional Self Injurious Behavior: None Risk to Others: Homicidal Ideation: No Thoughts of Harm to Others: No Current Homicidal Intent: No Current Homicidal Plan: No Access to Homicidal Means: No Identified Victim: NA History of harm to others?: No Assessment of Violence: None Noted Violent Behavior Description: None Reported Does patient have access to weapons?: No Criminal Charges Pending?: No Does patient have a court date: No Prior Inpatient Therapy: Prior Inpatient Therapy: Yes Prior Therapy Dates: 2017 Prior Therapy Facilty/Provider(s): [facility] Reason for Treatment: Psychoisi and SI Prior Outpatient Therapy: Prior Outpatient Therapy: Yes Prior Therapy Dates: Ongoing  Prior Therapy Facilty/Provider(s): Dr. Josph Macho Reason for Treatment: Si and Psychosis Does patient have an ACCT team?: No Does patient have Intensive In-House Services?  : No Does patient have [facility] services? : No Does patient have P4CC services?: No  Past Medical History:  Past Medical History:  Diagnosis Date  . Anxiety   .  Conversion  disorder   . Depression   . Diverticulitis   . Hyperlipidemia   . Hypertension   . Migraine   . Plantar fascial fibromatosis   . Sleep apnea   . Syncope and collapse     Past Surgical History:  Procedure Laterality Date  . COLON SURGERY    . GALLBLADDER SURGERY    . URETHRA SURGERY     Family History:  Family History  Problem Relation Age of Onset  . Arrhythmia Maternal Grandmother     heart murmur   . Hypertension Maternal Grandmother   . Hypertension Mother   . Anxiety disorder Mother   . Heart failure Father   . Bipolar disorder Paternal Uncle   . Schizophrenia Paternal Uncle    Family Psychiatric  History: Does not report to me any psychiatric family history Social History:  History  Alcohol Use No     History  Drug Use No    Social History   Social History  . Marital status: Married    Spouse name: N/A  . Number of children: N/A  . Years of education: N/A   Social History Main Topics  . Smoking status: Never Smoker  . Smokeless tobacco: Never Used  . Alcohol use No  . Drug use: No  . Sexual activity: Yes   Other Topics Concern  . Not on file   Social History Narrative  . No narrative on file   Additional Social History:    Allergies:   Allergies  Allergen Reactions  . Amoxicillin Anaphylaxis and Other (See Comments)    Has patient had a PCN reaction causing immediate rash, facial/tongue/throat swelling, SOB or lightheadedness with hypotension: Yes Has patient had a PCN reaction causing severe rash involving mucus membranes or skin necrosis: No Has patient had a PCN reaction that required hospitalization No Has patient had a PCN reaction occurring within the last 10 years: No If all of the above answers are "NO", then may proceed with Cephalosporin use.  Marland Kitchen Penicillins Anaphylaxis and Other (See Comments)    Has patient had a PCN reaction causing immediate rash, facial/tongue/throat swelling, SOB or lightheadedness with hypotension: Yes Has  patient had a PCN reaction causing severe rash involving mucus membranes or skin necrosis: No Has patient had a PCN reaction that required hospitalization No Has patient had a PCN reaction occurring within the last 10 years: No If all of the above answers are "NO", then may proceed with Cephalosporin use.    Labs:  Results for orders placed or performed during the hospital encounter of 12/17/15 (from the past 48 hour(s))  Comprehensive metabolic panel     Status: Abnormal   Collection Time: 12/17/15  3:06 PM  Result Value Ref Range   Sodium 140 135 - 145 mmol/L   Potassium 4.0 3.5 - 5.1 mmol/L   Chloride 109 101 - 111 mmol/L   CO2 26 22 - 32 mmol/L   Glucose, Bld 95 65 - 99 mg/dL   BUN 14 6 - 20 mg/dL   Creatinine, Ser 1.25 (H) 0.61 - 1.24 mg/dL   Calcium 9.2 8.9 - 10.3 mg/dL   Total Protein 7.9 6.5 - 8.1 g/dL   Albumin 4.4 3.5 - 5.0 g/dL   AST 24 15 - 41 U/L   ALT 34 17 - 63 U/L   Alkaline Phosphatase 62 38 - 126 U/L   Total Bilirubin 1.1 0.3 - 1.2 mg/dL   GFR calc non Af Amer >60 >60 mL/min  GFR calc Af Amer >60 >60 mL/min    Comment: (NOTE) The eGFR has been calculated using the CKD EPI equation. This calculation has not been validated in all clinical situations. eGFR's persistently <60 mL/min signify possible Chronic Kidney Disease.    Anion gap 5 5 - 15  Ethanol     Status: None   Collection Time: 12/17/15  3:06 PM  Result Value Ref Range   Alcohol, Ethyl (B) <5 <5 mg/dL    Comment:        LOWEST DETECTABLE LIMIT FOR SERUM ALCOHOL IS 5 mg/dL FOR MEDICAL PURPOSES ONLY   Salicylate level     Status: None   Collection Time: 12/17/15  3:06 PM  Result Value Ref Range   Salicylate Lvl <2.4 2.8 - 30.0 mg/dL  Acetaminophen level     Status: Abnormal   Collection Time: 12/17/15  3:06 PM  Result Value Ref Range   Acetaminophen (Tylenol), Serum <10 (L) 10 - 30 ug/mL    Comment:        THERAPEUTIC CONCENTRATIONS VARY SIGNIFICANTLY. A RANGE OF 10-30 ug/mL MAY BE AN  EFFECTIVE CONCENTRATION FOR MANY PATIENTS. HOWEVER, SOME ARE BEST TREATED AT CONCENTRATIONS OUTSIDE THIS RANGE. ACETAMINOPHEN CONCENTRATIONS >150 ug/mL AT 4 HOURS AFTER INGESTION AND >50 ug/mL AT 12 HOURS AFTER INGESTION ARE OFTEN ASSOCIATED WITH TOXIC REACTIONS.   cbc     Status: None   Collection Time: 12/17/15  3:06 PM  Result Value Ref Range   WBC 8.9 3.8 - 10.6 K/uL   RBC 5.09 4.40 - 5.90 MIL/uL   Hemoglobin 15.9 13.0 - 18.0 g/dL   HCT 45.9 40.0 - 52.0 %   MCV 90.1 80.0 - 100.0 fL   MCH 31.2 26.0 - 34.0 pg   MCHC 34.6 32.0 - 36.0 g/dL   RDW 13.5 11.5 - 14.5 %   Platelets 178 150 - 440 K/uL  Urine Drug Screen, Qualitative     Status: None   Collection Time: 12/17/15  3:06 PM  Result Value Ref Range   Tricyclic, Ur Screen NONE DETECTED NONE DETECTED   Amphetamines, Ur Screen NONE DETECTED NONE DETECTED   MDMA (Ecstasy)Ur Screen NONE DETECTED NONE DETECTED   Cocaine Metabolite,Ur [place] NONE DETECTED NONE DETECTED   Opiate, Ur Screen NONE DETECTED NONE DETECTED   Phencyclidine (PCP) Ur S NONE DETECTED NONE DETECTED   Cannabinoid 50 Ng, Ur [place] NONE DETECTED NONE DETECTED   Barbiturates, Ur Screen NONE DETECTED NONE DETECTED   Benzodiazepine, Ur Scrn NONE DETECTED NONE DETECTED   Methadone Scn, Ur NONE DETECTED NONE DETECTED    Comment: (NOTE) 825  Tricyclics, urine               Cutoff 1000 ng/mL 200  Amphetamines, urine             Cutoff 1000 ng/mL 300  MDMA (Ecstasy), urine           Cutoff 500 ng/mL 400  Cocaine Metabolite, urine       Cutoff 300 ng/mL 500  Opiate, urine                   Cutoff 300 ng/mL 600  Phencyclidine (PCP), urine      Cutoff 25 ng/mL 700  Cannabinoid, urine              Cutoff 50 ng/mL 800  Barbiturates, urine             Cutoff 200 ng/mL 900  Benzodiazepine, urine  Cutoff 200 ng/mL 1000 Methadone, urine                Cutoff 300 ng/mL 1100 1200 The urine drug screen provides only a preliminary, unconfirmed 1300 analytical test result  and should not be used for non-medical 1400 purposes. Clinical consideration and professional judgment should 1500 be applied to any positive drug screen result due to possible 1600 interfering substances. A more specific alternate chemical method 1700 must be used in order to obtain a confirmed analytical result.  1800 Gas chromato graphy / mass spectrometry (GC/MS) is the preferred 1900 confirmatory method.     Current Facility-Administered Medications  Medication Dose Route Frequency Provider Last Rate Last Dose  . albuterol (PROVENTIL HFA;VENTOLIN HFA) 108 (90 Base) MCG/ACT inhaler 2 puff  2 puff Inhalation Q6H PRN Joanne Gavel, MD      . buPROPion (WELLBUTRIN XL) 24 hr tablet 300 mg  300 mg Oral Daily Rudene Re, MD   300 mg at 12/19/15 4854  . clonazePAM (KLONOPIN) tablet 1 mg  1 mg Oral BID PRN Joanne Gavel, MD      . cyclobenzaprine (FLEXERIL) tablet 5 mg  5 mg Oral TID PRN Joanne Gavel, MD      . fluvoxaMINE (LUVOX) tablet 100 mg  100 mg Oral QHS Joanne Gavel, MD   100 mg at 12/18/15 2109  . losartan (COZAAR) tablet 100 mg  100 mg Oral QHS Joanne Gavel, MD   100 mg at 12/18/15 2108  . metoprolol succinate (TOPROL-XL) 24 hr tablet 25 mg  25 mg Oral Daily Joanne Gavel, MD   25 mg at 12/19/15 0924  . mometasone-formoterol (DULERA) 200-5 MCG/ACT inhaler 2 puff  2 puff Inhalation BID Joanne Gavel, MD   2 puff at 12/19/15 0926  . pantoprazole (PROTONIX) EC tablet 40 mg  40 mg Oral QAC breakfast Joanne Gavel, MD   40 mg at 12/19/15 0924  . prazosin (MINIPRESS) capsule 2 mg  2 mg Oral QHS Joanne Gavel, MD   2 mg at 12/18/15 2109  . risperiDONE (RISPERDAL) tablet 2 mg  2 mg Oral QHS Joanne Gavel, MD   2 mg at 12/18/15 2109  . topiramate (TOPAMAX) tablet 50 mg  50 mg Oral BID Joanne Gavel, MD   50 mg at 12/19/15 0923  . traZODone (DESYREL) tablet 100 mg  100 mg Oral QHS Joanne Gavel, MD   100 mg at 12/18/15 2108   Current Outpatient Prescriptions  Medication Sig Dispense  Refill  . albuterol (PROVENTIL HFA;VENTOLIN HFA) 108 (90 BASE) MCG/ACT inhaler Inhale 2 puffs into the lungs every 6 (six) hours as needed for wheezing or shortness of breath.     Marland Kitchen buPROPion (WELLBUTRIN XL) 150 MG 24 hr tablet Take 150 mg by mouth daily.    . clonazePAM (KLONOPIN) 1 MG tablet Take 1 tablet (1 mg total) by mouth 2 (two) times daily as needed (anxiety). 60 tablet 0  . cyclobenzaprine (FLEXERIL) 5 MG tablet Take 5 mg by mouth 3 (three) times daily as needed for muscle spasms.    . fluticasone-salmeterol (ADVAIR HFA) 115-21 MCG/ACT inhaler Inhale 2 puffs into the lungs 2 (two) times daily.    . fluvoxaMINE (LUVOX) 100 MG tablet Take 1 tablet (100 mg total) by mouth at bedtime. 30 tablet 1  . guanFACINE (TENEX) 1 MG tablet Take 1 mg by mouth daily.    Marland Kitchen losartan (COZAAR) 100 MG tablet Take  100 mg by mouth at bedtime.     . metoprolol succinate (TOPROL-XL) 25 MG 24 hr tablet Take 25 mg by mouth daily.    . pantoprazole (PROTONIX) 40 MG tablet Take 40 mg by mouth daily.     . prazosin (MINIPRESS) 2 MG capsule Take 1 capsule (2 mg total) by mouth at bedtime. 30 capsule 0  . risperiDONE (RISPERDAL) 1 MG tablet Take 2 tablets (2 mg total) by mouth at bedtime. 30 tablet 0  . SUMAtriptan (IMITREX) 50 MG tablet Take 50 mg by mouth every 2 (two) hours as needed for migraine.   1  . topiramate (TOPAMAX) 50 MG tablet Take 50 mg by mouth 2 (two) times daily.     . traZODone (DESYREL) 100 MG tablet Take 1 tablet (100 mg total) by mouth at bedtime. 30 tablet 0    Musculoskeletal: Strength & Muscle Tone: within normal limits Gait & Station: normal Patient leans: N/A  Psychiatric Specialty Exam: Physical Exam  Nursing note and vitals reviewed. Constitutional: He appears well-developed and well-nourished.  HENT:  Head: Normocephalic and atraumatic.  Eyes: Conjunctivae are normal. Pupils are equal, round, and reactive to light.  Neck: Normal range of motion.  Cardiovascular: Normal heart  sounds.   Respiratory: Effort normal.  GI: Soft.  Musculoskeletal: Normal range of motion.  Neurological: He is alert.  Skin: Skin is warm and dry.  Psychiatric: He has a normal mood and affect. His behavior is normal. Judgment and thought content normal.    Review of Systems  Constitutional: Negative.   HENT: Negative.   Eyes: Negative.   Respiratory: Negative.   Cardiovascular: Negative.   Gastrointestinal: Negative.   Musculoskeletal: Negative.   Skin: Negative.   Neurological: Negative.   Psychiatric/Behavioral: Positive for depression, hallucinations and suicidal ideas. Negative for memory loss and substance abuse. The patient is nervous/anxious. The patient does not have insomnia.     Blood pressure 135/83, pulse 73, temperature 97.5 F (36.4 C), temperature source Oral, resp. rate 18, height 6' (1.829 m), weight 109.8 kg (242 lb), SpO2 100 %.Body mass index is 32.82 kg/m.  General Appearance: Casual  Eye Contact:  Fair  Speech:  Clear and Coherent  Volume:  Normal  Mood:  Euthymic  Affect:  Congruent  Thought Process:  Goal Directed  Orientation:  Full (Time, Place, and Person)  Thought Content:  Logical  Suicidal Thoughts:  Yes.  without intent/plan  Homicidal Thoughts:  No  Memory:  Immediate;   Good Recent;   Fair Remote;   Fair  Judgement:  Fair  Insight:  Fair  Psychomotor Activity:  Decreased  Concentration:  Concentration: Fair  Recall:  [employer] of Knowledge:  Fair  Language:  Fair  Akathisia:  No  Handed:  Right  AIMS (if indicated):     Assets:  Communication Skills Desire for Improvement Financial Resources/Insurance Housing Intimacy Social Support  ADL's:  Intact  Cognition:  WNL  Sleep:        Treatment Plan Summary: Daily contact with patient to assess and evaluate symptoms and progress in treatment, Medication management and Plan Patient with a history of psychiatric problems presented with very clearly stated suicidal thoughts and  hallucinations which only now he says are improving. Plans had Artie been made for admission to psychiatric facility at [facility] [address] [address]. Patient is requesting to have this reconsidered but under the circumstances with a past history of suicidal behavior and complicated psychiatric problems it appears safer to me to continue  the IVC and allow him to be admitted to the hospital. Patient will be transferred to [facility] today. By mistake he was given 450 mg of Wellbutrin today which I don't think is appropriate. I have changed that back to the 300.  Disposition: Recommend psychiatric Inpatient admission when medically cleared. Supportive therapy provided about ongoing stressors.  Alethia Berthold, MD 12/19/2015 11:33 AM

## 2015-12-19 NOTE — ED Notes (Signed)
Escorted by sherriff to [facility] in [place].

## 2016-04-09 ENCOUNTER — Other Ambulatory Visit: Payer: Self-pay | Admitting: Neurology

## 2016-04-09 DIAGNOSIS — R55 Syncope and collapse: Secondary | ICD-10-CM

## 2016-04-12 ENCOUNTER — Ambulatory Visit (HOSPITAL_COMMUNITY)
Admission: RE | Admit: 2016-04-12 | Discharge: 2016-04-12 | Disposition: A | Discharge status: Home / Self Care | Payer: 59 | Source: Ambulatory Visit | Attending: Neurology | Admitting: Neurology

## 2016-04-12 DIAGNOSIS — R55 Syncope and collapse: Secondary | ICD-10-CM | POA: Diagnosis not present

## 2016-04-12 DIAGNOSIS — Z79899 Other long term (current) drug therapy: Secondary | ICD-10-CM | POA: Diagnosis not present

## 2016-04-12 NOTE — Progress Notes (Signed)
EEG completed; results pending.    

## 2016-04-12 NOTE — Procedures (Signed)
ELECTROENCEPHALOGRAM REPORT  Date of Study: 04/12/2016  Patient's Name: Arthur Howe MRN: [id number] Date of Birth: [birth year]  Referring Provider: Cherylann Banas  Clinical History: 51 year old male with 4 year history of syncopal episodes associated with convulsions for 2-3 minutes.followed by confusion and disorientation.  Medications: Klonopin, Risperdal, Topomax, Fluvoxamine  Technical Summary: A multichannel digital EEG recording measured by the international 10-20 system with electrodes applied with paste and impedances below 5000 ohms performed in our laboratory with EKG monitoring in an awake and drowsy patient.  Hyperventilation and photic stimulation were performed.  The digital EEG was referentially recorded, reformatted, and digitally filtered in a variety of bipolar and referential montages for optimal display.    Description: The patient is awake and drowsy during the recording.  During maximal wakefulness, there is a symmetric, medium voltage 8 Hz posterior dominant rhythm that attenuates with eye opening.  The record is symmetric.  During drowsiness, there is an increase in theta slowing of the background.  Vertex waves and symmetric sleep spindles were not seen.  Hyperventilation and photic stimulation did not elicit any abnormalities.  There were no epileptiform discharges or electrographic seizures seen.    EKG lead was unremarkable.  Impression: This awake and drowsy EEG is normal.    Clinical Correlation: A normal EEG does not exclude a clinical diagnosis of epilepsy.  If further clinical questions remain, prolonged EEG may be helpful.  Clinical correlation is advised.   Metta Clines, DO

## 2017-10-11 ENCOUNTER — Encounter: Payer: Self-pay | Admitting: *Deleted

## 2017-10-14 ENCOUNTER — Ambulatory Visit: Payer: BLUE CROSS/BLUE SHIELD | Admitting: Certified Registered Nurse Anesthetist

## 2017-10-14 ENCOUNTER — Encounter
Admission: RE | Disposition: A | Discharge status: Home / Self Care | Payer: Self-pay | Source: Ambulatory Visit | Attending: Unknown Physician Specialty

## 2017-10-14 ENCOUNTER — Ambulatory Visit
Admission: RE | Admit: 2017-10-14 | Discharge: 2017-10-14 | Disposition: A | Discharge status: Home / Self Care | Payer: BLUE CROSS/BLUE SHIELD | Source: Ambulatory Visit | Attending: Unknown Physician Specialty | Admitting: Unknown Physician Specialty

## 2017-10-14 DIAGNOSIS — F329 Major depressive disorder, single episode, unspecified: Secondary | ICD-10-CM | POA: Diagnosis not present

## 2017-10-14 DIAGNOSIS — Z8249 Family history of ischemic heart disease and other diseases of the circulatory system: Secondary | ICD-10-CM | POA: Insufficient documentation

## 2017-10-14 DIAGNOSIS — Z880 Allergy status to penicillin: Secondary | ICD-10-CM | POA: Diagnosis not present

## 2017-10-14 DIAGNOSIS — D125 Benign neoplasm of sigmoid colon: Secondary | ICD-10-CM | POA: Diagnosis not present

## 2017-10-14 DIAGNOSIS — J45909 Unspecified asthma, uncomplicated: Secondary | ICD-10-CM | POA: Diagnosis not present

## 2017-10-14 DIAGNOSIS — Z8719 Personal history of other diseases of the digestive system: Secondary | ICD-10-CM | POA: Diagnosis not present

## 2017-10-14 DIAGNOSIS — I10 Essential (primary) hypertension: Secondary | ICD-10-CM | POA: Diagnosis not present

## 2017-10-14 DIAGNOSIS — K640 First degree hemorrhoids: Secondary | ICD-10-CM | POA: Diagnosis not present

## 2017-10-14 DIAGNOSIS — Z1211 Encounter for screening for malignant neoplasm of colon: Secondary | ICD-10-CM | POA: Diagnosis present

## 2017-10-14 DIAGNOSIS — R569 Unspecified convulsions: Secondary | ICD-10-CM | POA: Insufficient documentation

## 2017-10-14 DIAGNOSIS — Z9989 Dependence on other enabling machines and devices: Secondary | ICD-10-CM | POA: Insufficient documentation

## 2017-10-14 DIAGNOSIS — K5730 Diverticulosis of large intestine without perforation or abscess without bleeding: Secondary | ICD-10-CM | POA: Diagnosis not present

## 2017-10-14 DIAGNOSIS — E785 Hyperlipidemia, unspecified: Secondary | ICD-10-CM | POA: Insufficient documentation

## 2017-10-14 DIAGNOSIS — D124 Benign neoplasm of descending colon: Secondary | ICD-10-CM | POA: Insufficient documentation

## 2017-10-14 DIAGNOSIS — G4730 Sleep apnea, unspecified: Secondary | ICD-10-CM | POA: Insufficient documentation

## 2017-10-14 DIAGNOSIS — K635 Polyp of colon: Secondary | ICD-10-CM | POA: Insufficient documentation

## 2017-10-14 DIAGNOSIS — K219 Gastro-esophageal reflux disease without esophagitis: Secondary | ICD-10-CM | POA: Insufficient documentation

## 2017-10-14 DIAGNOSIS — Z79899 Other long term (current) drug therapy: Secondary | ICD-10-CM | POA: Insufficient documentation

## 2017-10-14 DIAGNOSIS — F419 Anxiety disorder, unspecified: Secondary | ICD-10-CM | POA: Diagnosis not present

## 2017-10-14 HISTORY — DX: Unspecified asthma, uncomplicated: J45.909

## 2017-10-14 HISTORY — PX: COLONOSCOPY WITH PROPOFOL: SHX5780

## 2017-10-14 HISTORY — DX: Gastro-esophageal reflux disease without esophagitis: K21.9

## 2017-10-14 SURGERY — COLONOSCOPY WITH PROPOFOL
Anesthesia: General

## 2017-10-14 MED ORDER — PROPOFOL 500 MG/50ML IV EMUL
INTRAVENOUS | Status: DC | PRN
  Administered 2017-10-14: 175 ug/kg/min via INTRAVENOUS

## 2017-10-14 MED ORDER — PHENYLEPHRINE HCL 10 MG/ML IJ SOLN
INTRAMUSCULAR | Status: DC | PRN
  Administered 2017-10-14 (×2): 100 ug via INTRAVENOUS

## 2017-10-14 MED ORDER — PROPOFOL 10 MG/ML IV BOLUS
INTRAVENOUS | Status: DC | PRN
  Administered 2017-10-14: 80 mg via INTRAVENOUS

## 2017-10-14 MED ORDER — SODIUM CHLORIDE 0.9 % IV SOLN
INTRAVENOUS | Status: DC
  Administered 2017-10-14: 09:00:00 via INTRAVENOUS

## 2017-10-14 MED ORDER — PROPOFOL 500 MG/50ML IV EMUL
INTRAVENOUS | Status: AC
  Filled 2017-10-14: qty 50

## 2017-10-14 MED ORDER — LIDOCAINE HCL (CARDIAC) PF 100 MG/5ML IV SOSY
PREFILLED_SYRINGE | INTRAVENOUS | Status: DC | PRN
  Administered 2017-10-14: 50 mg via INTRAVENOUS

## 2017-10-14 NOTE — Anesthesia Post-op Follow-up Note (Signed)
Anesthesia QCDR form completed.        

## 2017-10-14 NOTE — Op Note (Signed)
[facility] Gastroenterology Patient Name: Arthur Howe Procedure Date: 10/14/2017 8:52 AM MRN: [id number] Account #: [id number] Date of Birth: [birth year] Admit Type: Outpatient Age: 52 Room: [facility] ENDO ROOM 1 Gender: Male Note Status: Finalized Procedure:            Colonoscopy Indications:          Screening for colorectal malignant neoplasm Providers:            Manya Silvas, MD Referring MD:         Youlanda Roys. Lovie Macadamia, MD (Referring MD) Medicines:            Propofol per Anesthesia Complications:        No immediate complications. Procedure:            Pre-Anesthesia Assessment:                       - After reviewing the risks and benefits, the patient                        was deemed in satisfactory condition to undergo the                        procedure.                       After obtaining informed consent, the colonoscope was                        passed under direct vision. Throughout the procedure,                        the patient's blood pressure, pulse, and oxygen                        saturations were monitored continuously. The                        Colonoscope was introduced through the anus and                        advanced to the the cecum, identified by appendiceal                        orifice and ileocecal valve. The colonoscopy was                        performed without difficulty. The patient tolerated the                        procedure well. The quality of the bowel preparation                        was good. Findings:      A diminutive polyp was found in the ascending colon. The polyp was       sessile. The polyp was removed with a jumbo cold forceps. Resection and       retrieval were complete.      A small polyp was found in the descending colon. The polyp was sessile.       The polyp was removed with a hot snare. Resection and retrieval were  complete.      A small polyp was found in the sigmoid colon. The  polyp was sessile. The       polyp was removed with a hot snare. Resection and retrieval were       complete.      A few medium-mouthed diverticula were found in the sigmoid colon,       descending colon and transverse colon. Area of previous surgery noted.      Internal hemorrhoids were found during endoscopy. The hemorrhoids were       small and Grade I (internal hemorrhoids that do not prolapse).      The exam was otherwise without abnormality. Impression:           - One diminutive polyp in the ascending colon, removed                        with a jumbo cold forceps. Resected and retrieved.                       - One small polyp in the descending colon, removed with                        a hot snare. Resected and retrieved.                       - One small polyp in the sigmoid colon, removed with a                        hot snare. Resected and retrieved.                       - Diverticulosis in the sigmoid colon, in the                        descending colon and in the transverse colon.                       - Internal hemorrhoids.                       - The examination was otherwise normal. Recommendation:       - Await pathology results. Manya Silvas, MD 10/14/2017 9:31:23 AM This report has been signed electronically. Number of Addenda: 0 Note Initiated On: 10/14/2017 8:52 AM Scope Withdrawal Time: 0 hours 14 minutes 13 seconds  Total Procedure Duration: 0 hours 19 minutes 11 seconds       [facility]

## 2017-10-14 NOTE — Anesthesia Procedure Notes (Signed)
Date/Time: 10/14/2017 9:04 AM Performed by: Johnna Acosta, CRNA Pre-anesthesia Checklist: Patient identified, Emergency Drugs available, Suction available, Patient being monitored and Timeout performed Patient Re-evaluated:Patient Re-evaluated prior to induction Oxygen Delivery Method: Nasal cannula Preoxygenation: Pre-oxygenation with 100% oxygen

## 2017-10-14 NOTE — H&P (Signed)
Primary Care Physician:  Juluis Pitch, MD Primary Gastroenterologist:  Dr. Vira Agar  Pre-Procedure History & Physical: HPI:  Arthur Howe is a 52 y.o. male is here for an colonoscopy.  For colon cancer screening.   Past Medical History:  Diagnosis Date  . Anxiety   . Asthma   . Conversion disorder   . Depression   . Diverticulitis   . GERD (gastroesophageal reflux disease)   . Hyperlipidemia   . Hypertension   . Migraine   . Plantar fascial fibromatosis   . Sleep apnea   . Syncope and collapse     Past Surgical History:  Procedure Laterality Date  . COLON SURGERY    . GALLBLADDER SURGERY    . URETHRA SURGERY      Prior to Admission medications   Medication Sig Start Date End Date Taking? Authorizing Provider  albuterol (PROVENTIL HFA;VENTOLIN HFA) 108 (90 BASE) MCG/ACT inhaler Inhale 2 puffs into the lungs every 6 (six) hours as needed for wheezing or shortness of breath.    Yes [provider]  buPROPion (WELLBUTRIN XL) 150 MG 24 hr tablet Take 150 mg by mouth daily.   Yes [provider]  citalopram (CELEXA) 40 MG tablet Take 40 mg by mouth daily.   Yes [provider]  clonazePAM (KLONOPIN) 1 MG tablet Take 1 tablet (1 mg total) by mouth 2 (two) times daily as needed (anxiety). 06/20/15  Yes Pucilowska, Jolanta B, MD  fexofenadine (ALLEGRA) 180 MG tablet Take 180 mg by mouth daily.   Yes [provider]  fluticasone (FLONASE) 50 MCG/ACT nasal spray Place 2 sprays into both nostrils every 12 (twelve) hours.   Yes [provider]  fluticasone-salmeterol (ADVAIR HFA) 115-21 MCG/ACT inhaler Inhale 2 puffs into the lungs 2 (two) times daily.   Yes [provider]  fluvoxaMINE (LUVOX) 100 MG tablet Take 1 tablet (100 mg total) by mouth at bedtime. 06/20/15  Yes Pucilowska, Jolanta B, MD  ibuprofen (ADVIL,MOTRIN) 400 MG tablet Take 400 mg by mouth every 6 (six) hours as needed.   Yes [provider]  losartan  (COZAAR) 100 MG tablet Take 100 mg by mouth at bedtime.    Yes [provider]  metoprolol succinate (TOPROL-XL) 25 MG 24 hr tablet Take 25 mg by mouth daily.   Yes [provider]  Multiple Vitamin (MULTIVITAMIN) tablet Take 1 tablet by mouth daily.   Yes [provider]  pantoprazole (PROTONIX) 40 MG tablet Take 40 mg by mouth daily.    Yes [provider]  prazosin (MINIPRESS) 2 MG capsule Take 1 capsule (2 mg total) by mouth at bedtime. 06/20/15  Yes Pucilowska, Jolanta B, MD  risperiDONE (RISPERDAL) 1 MG tablet Take 2 tablets (2 mg total) by mouth at bedtime. 06/20/15  Yes Pucilowska, Jolanta B, MD  SUMAtriptan (IMITREX) 50 MG tablet Take 50 mg by mouth every 2 (two) hours as needed for migraine.    Yes [provider]  traZODone (DESYREL) 100 MG tablet Take 1 tablet (100 mg total) by mouth at bedtime. 06/20/15  Yes Pucilowska, Jolanta B, MD  cyclobenzaprine (FLEXERIL) 5 MG tablet Take 5 mg by mouth 3 (three) times daily as needed for muscle spasms.    [provider]  gabapentin (NEURONTIN) 100 MG capsule Take 100 mg by mouth daily.    [provider]  guanFACINE (TENEX) 1 MG tablet Take 1 mg by mouth daily. 12/06/15   [provider]  topiramate (TOPAMAX) 50  MG tablet Take 50 mg by mouth 2 (two) times daily.     [provider]    Allergies as of 08/05/2017 - Review Complete 12/17/2015  Allergen Reaction Noted  . Amoxicillin Anaphylaxis and Other (See Comments) 08/17/2014  . Penicillins Anaphylaxis and Other (See Comments)     Family History  Problem Relation Age of Onset  . Arrhythmia Maternal Grandmother        heart murmur   . Hypertension Maternal Grandmother   . Hypertension Mother   . Anxiety disorder Mother   . Heart failure Father   . Bipolar disorder Paternal Uncle   . Schizophrenia Paternal Uncle     Social History   Socioeconomic History  . Marital status: Married    Spouse name: Not on  file  . Number of children: Not on file  . Years of education: Not on file  . Highest education level: Not on file  Occupational History  . Not on file  Social Needs  . Financial resource strain: Not on file  . Food insecurity:    Worry: Not on file    Inability: Not on file  . Transportation needs:    Medical: Not on file    Non-medical: Not on file  Tobacco Use  . Smoking status: Never Smoker  . Smokeless tobacco: Never Used  Substance and Sexual Activity  . Alcohol use: No    Alcohol/week: 0.0 standard drinks  . Drug use: No  . Sexual activity: Yes  Lifestyle  . Physical activity:    Days per week: Not on file    Minutes per session: Not on file  . Stress: Not on file  Relationships  . Social connections:    Talks on phone: Not on file    Gets together: Not on file    Attends religious service: Not on file    Active member of club or organization: Not on file    Attends meetings of clubs or organizations: Not on file    Relationship status: Not on file  . Intimate partner violence:    Fear of current or ex partner: Not on file    Emotionally abused: Not on file    Physically abused: Not on file    Forced sexual activity: Not on file  Other Topics Concern  . Not on file  Social History Narrative  . Not on file    Review of Systems: See HPI, otherwise negative ROS  Physical Exam: BP (!) 116/104   Pulse 92   Temp (!) 96.9 F (36.1 C) (Tympanic)   Resp 18   Ht 6' (1.829 m)   Wt 100.7 kg   SpO2 99%   BMI 30.11 kg/m  General:   Alert,  pleasant and cooperative in NAD Head:  Normocephalic and atraumatic. Neck:  Supple; no masses or thyromegaly. Lungs:  Clear throughout to auscultation.    Heart:  Regular rate and rhythm. Abdomen:  Soft, nontender and nondistended. Normal bowel sounds, without guarding, and without rebound.   Neurologic:  Alert and  oriented x4;  grossly normal neurologically.  Impression/Plan: Arthur Howe is here for an colonoscopy  to be performed for colon cancer screening.  Risks, benefits, limitations, and alternatives regarding  colonoscopy have been reviewed with the patient.  Questions have been answered.  All parties agreeable.   Gaylyn Cheers, MD  10/14/2017, 8:56 AM

## 2017-10-14 NOTE — Anesthesia Postprocedure Evaluation (Signed)
Anesthesia Post Note  Patient: Arthur Howe  Procedure(s) Performed: COLONOSCOPY WITH PROPOFOL (N/A )  Patient location during evaluation: Endoscopy Anesthesia Type: General Level of consciousness: awake and alert Pain management: pain level controlled Vital Signs Assessment: post-procedure vital signs reviewed and stable Respiratory status: spontaneous breathing, nonlabored ventilation, respiratory function stable and patient connected to nasal cannula oxygen Cardiovascular status: blood pressure returned to baseline and stable Postop Assessment: no apparent nausea or vomiting Anesthetic complications: no     Last Vitals:  Vitals:   10/14/17 1000 10/14/17 1008  BP: 115/81   Pulse: 77 71  Resp: 18 (!) 25  Temp:    SpO2: 99% 99%    Last Pain:  Vitals:   10/14/17 0932  TempSrc: Tympanic  PainSc:                  Arthur Howe

## 2017-10-14 NOTE — Anesthesia Preprocedure Evaluation (Signed)
Anesthesia Evaluation  Patient identified by MRN, date of birth, ID band Patient awake    Reviewed: Allergy & Precautions, H&P , NPO status , Patient's Chart, lab work & pertinent test results  History of Anesthesia Complications (+) PONV, Emergence Delirium and history of anesthetic complications  Airway Mallampati: III  TM Distance: <3 FB Neck ROM: full    Dental  (+) Chipped, Poor Dentition   Pulmonary asthma , sleep apnea and Continuous Positive Airway Pressure Ventilation ,           Cardiovascular Exercise Tolerance: Good hypertension, (-) angina(-) Past MI and (-) DOE      Neuro/Psych  Headaches, Seizures -, Well Controlled,  PSYCHIATRIC DISORDERS Anxiety Depression    GI/Hepatic Neg liver ROS, GERD  Medicated and Controlled,  Endo/Other  negative endocrine ROS  Renal/GU negative Renal ROS  negative genitourinary   Musculoskeletal   Abdominal   Peds  Hematology negative hematology ROS (+)   Anesthesia Other Findings Past Medical History: No date: Anxiety No date: Asthma No date: Conversion disorder No date: Depression No date: Diverticulitis No date: GERD (gastroesophageal reflux disease) No date: Hyperlipidemia No date: Hypertension No date: Migraine No date: Plantar fascial fibromatosis No date: Sleep apnea No date: Syncope and collapse  Past Surgical History: No date: COLON SURGERY No date: GALLBLADDER SURGERY No date: URETHRA SURGERY  BMI    Body Mass Index:  30.11 kg/m      Reproductive/Obstetrics negative OB ROS                             Anesthesia Physical Anesthesia Plan  ASA: III  Anesthesia Plan: General   Post-op Pain Management:    Induction: Intravenous  PONV Risk Score and Plan: Propofol infusion and TIVA  Airway Management Planned: Natural Airway and Nasal Cannula  Additional Equipment:   Intra-op Plan:   Post-operative Plan:    Informed Consent: I have reviewed the patients History and Physical, chart, labs and discussed the procedure including the risks, benefits and alternatives for the proposed anesthesia with the patient or authorized representative who has indicated his/her understanding and acceptance.   Dental Advisory Given  Plan Discussed with: Anesthesiologist, CRNA and Surgeon  Anesthesia Plan Comments: (Patient consented for risks of anesthesia including but not limited to:  - adverse reactions to medications - risk of intubation if required - damage to teeth, lips or other oral mucosa - sore throat or hoarseness - Damage to heart, brain, lungs or loss of life  Patient voiced understanding.)        Anesthesia Quick Evaluation

## 2017-10-14 NOTE — Transfer of Care (Signed)
Immediate Anesthesia Transfer of Care Note  Patient: Arthur Howe  Procedure(s) Performed: COLONOSCOPY WITH PROPOFOL (N/A )  Patient Location: PACU  Anesthesia Type:General  Level of Consciousness: sedated  Airway & Oxygen Therapy: Patient Spontanous Breathing and Patient connected to nasal cannula oxygen  Post-op Assessment: Report given to RN and Post -op Vital signs reviewed and stable  Post vital signs: Reviewed and stable  Last Vitals:  Vitals Value Taken Time  BP 99/64 10/14/2017  9:32 AM  Temp 36.3 C 10/14/2017  9:32 AM  Pulse 77 10/14/2017  9:33 AM  Resp 18 10/14/2017  9:33 AM  SpO2 98 % 10/14/2017  9:33 AM  Vitals shown include unvalidated device data.  Last Pain:  Vitals:   10/14/17 0932  TempSrc: Tympanic  PainSc:          Complications: No apparent anesthesia complications

## 2017-10-15 ENCOUNTER — Encounter: Payer: Self-pay | Admitting: Unknown Physician Specialty

## 2017-10-15 LAB — SURGICAL PATHOLOGY

## 2018-01-02 IMAGING — CR DG CHEST 2V
1 series · 2 of 2 positions shown · non-contrast
Comparison: December 24, 2010.

CLINICAL DATA: Acute left-sided chest pain.

EXAM:
CHEST  2 VIEW

[Series 1: dg chest 2 view · 0.14mm/px · 2 of 2 slices shown]
[im 1/2]
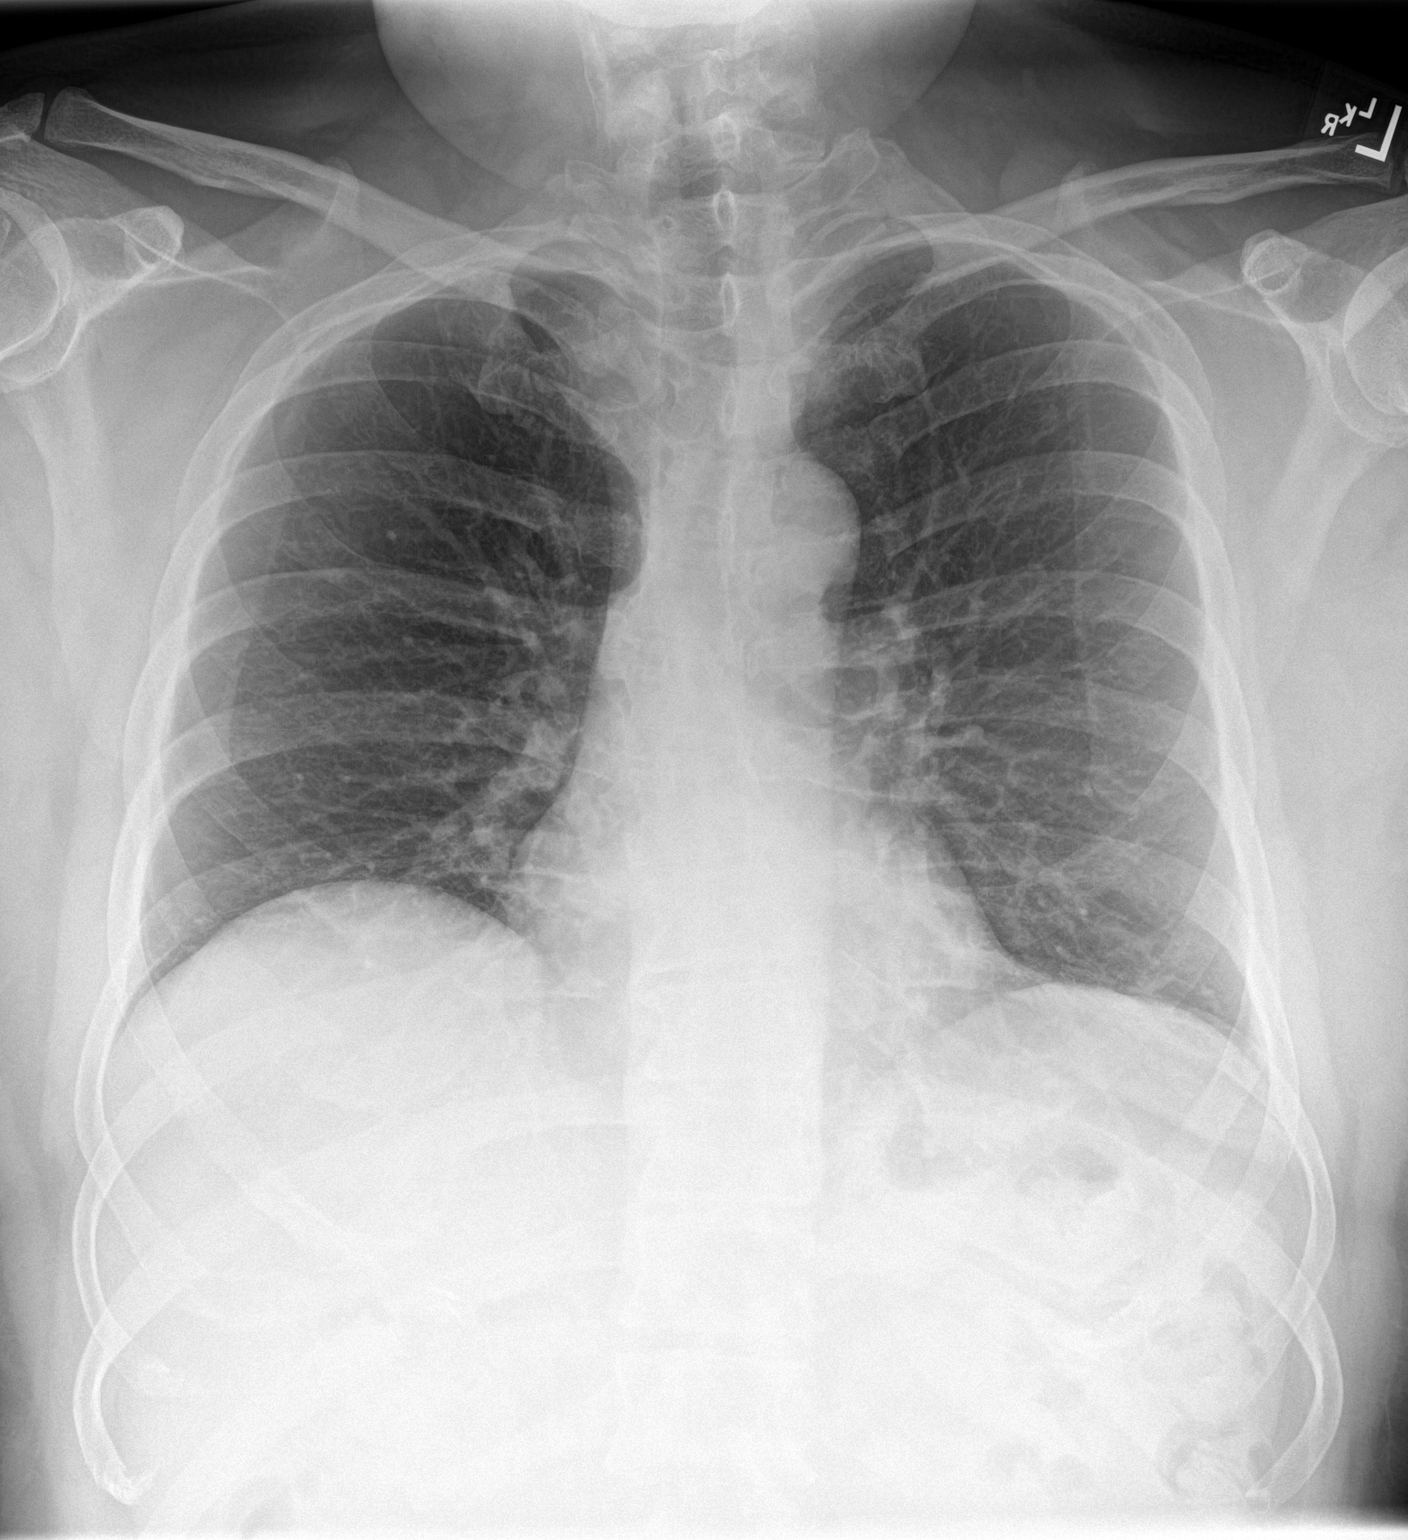
[im 2/2]
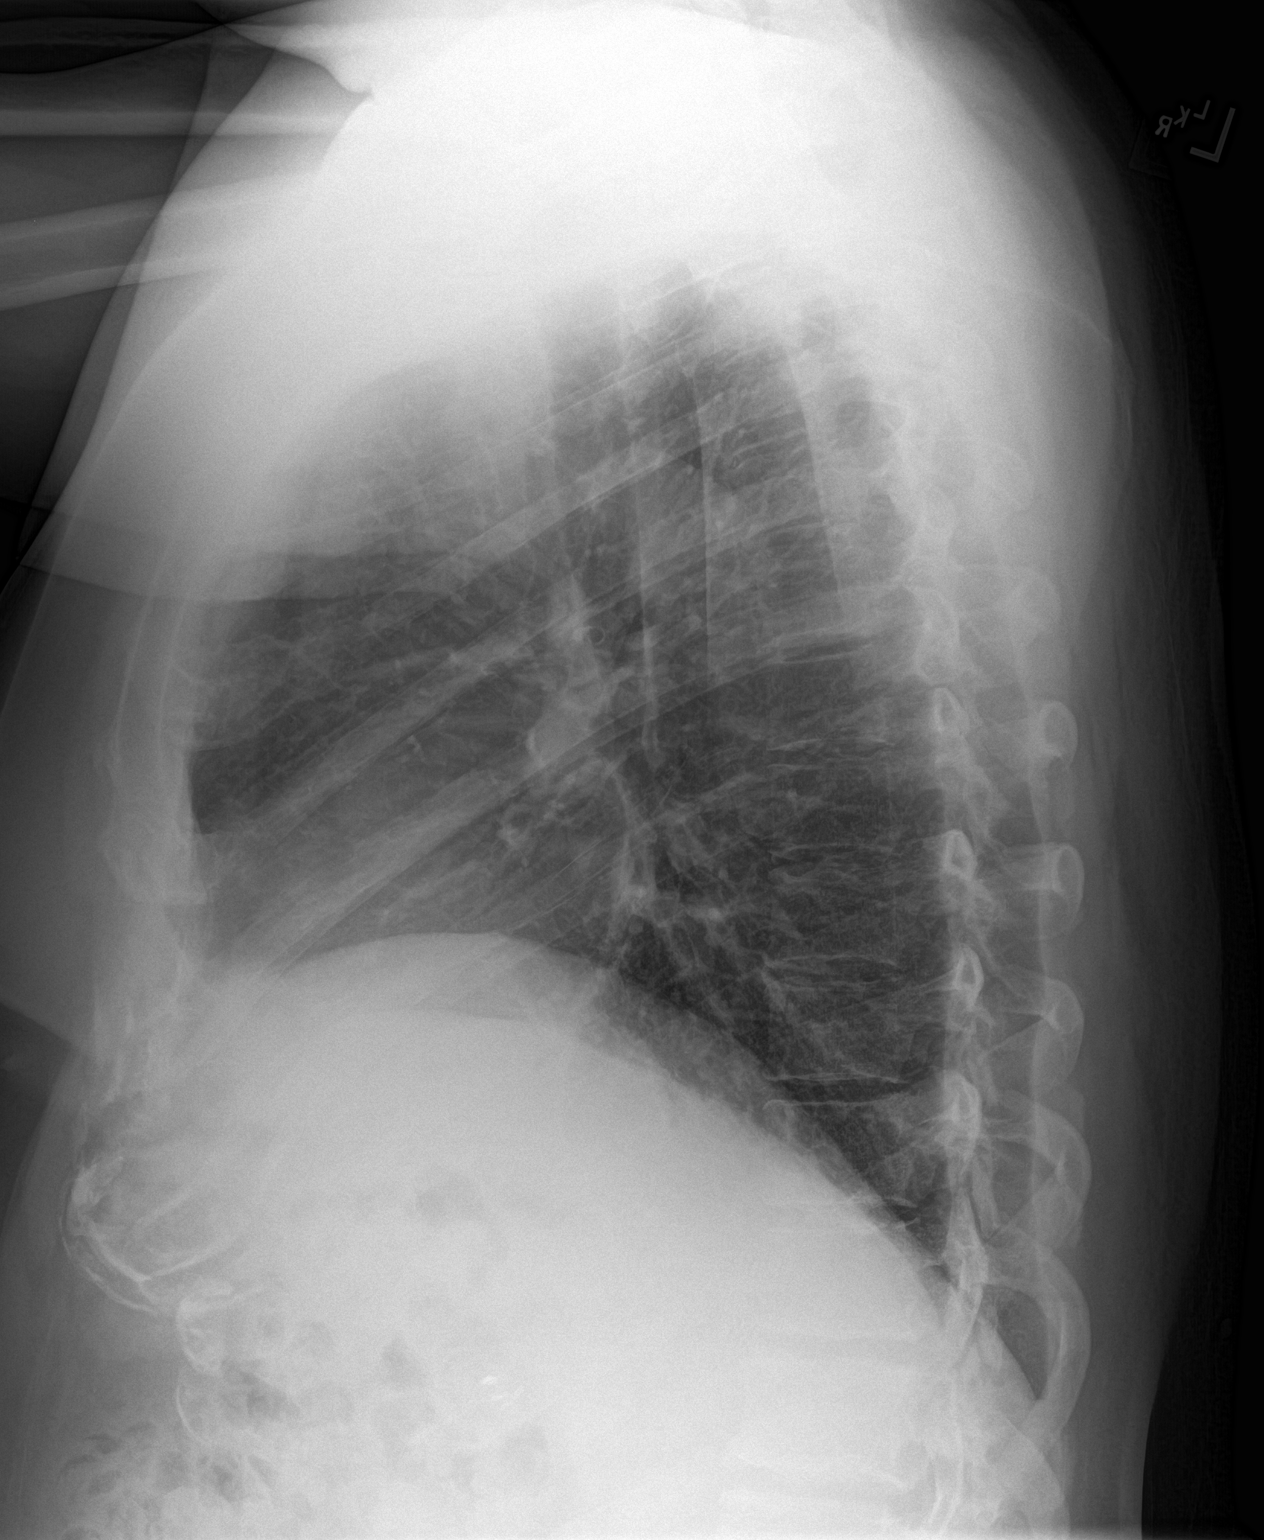

[2 of 2 positions shown; findings below may reference images not displayed]

FINDINGS: The heart size and mediastinal contours are within normal limits.
Both lungs are clear. No pneumothorax or pleural effusion is noted.
The visualized skeletal structures are unremarkable.
IMPRESSION: No active cardiopulmonary disease.

## 2018-03-04 IMAGING — CR DG CHEST 2V
1 series · 2 of 2 positions shown · non-contrast
Comparison: 03/09/2015

CLINICAL DATA: .  Chest injury.  Syncope

EXAM:
CHEST  2 VIEW

[Series 1: dg chest 2 view · 0.14mm/px · 2 of 2 slices shown]
[im 1/2]
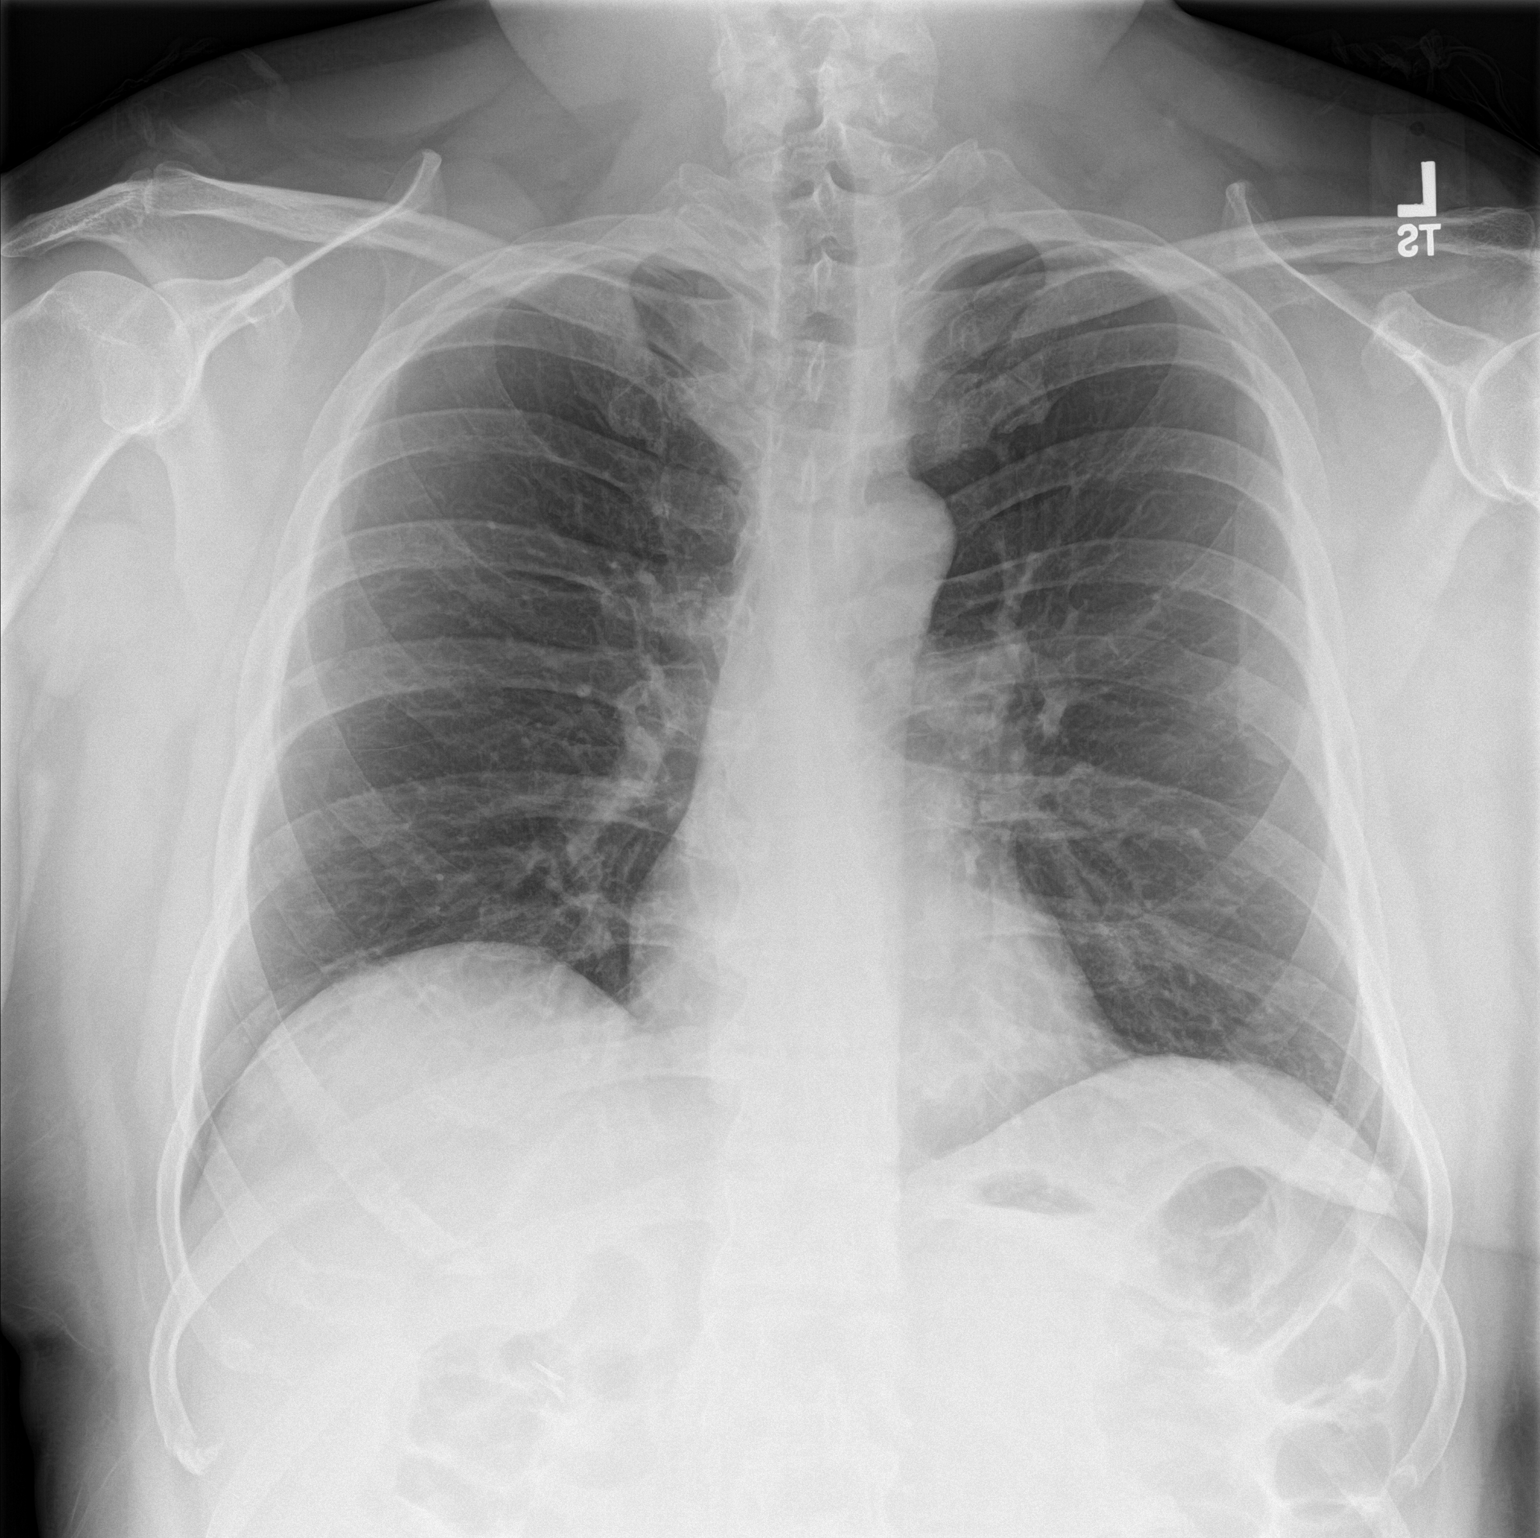
[im 2/2]
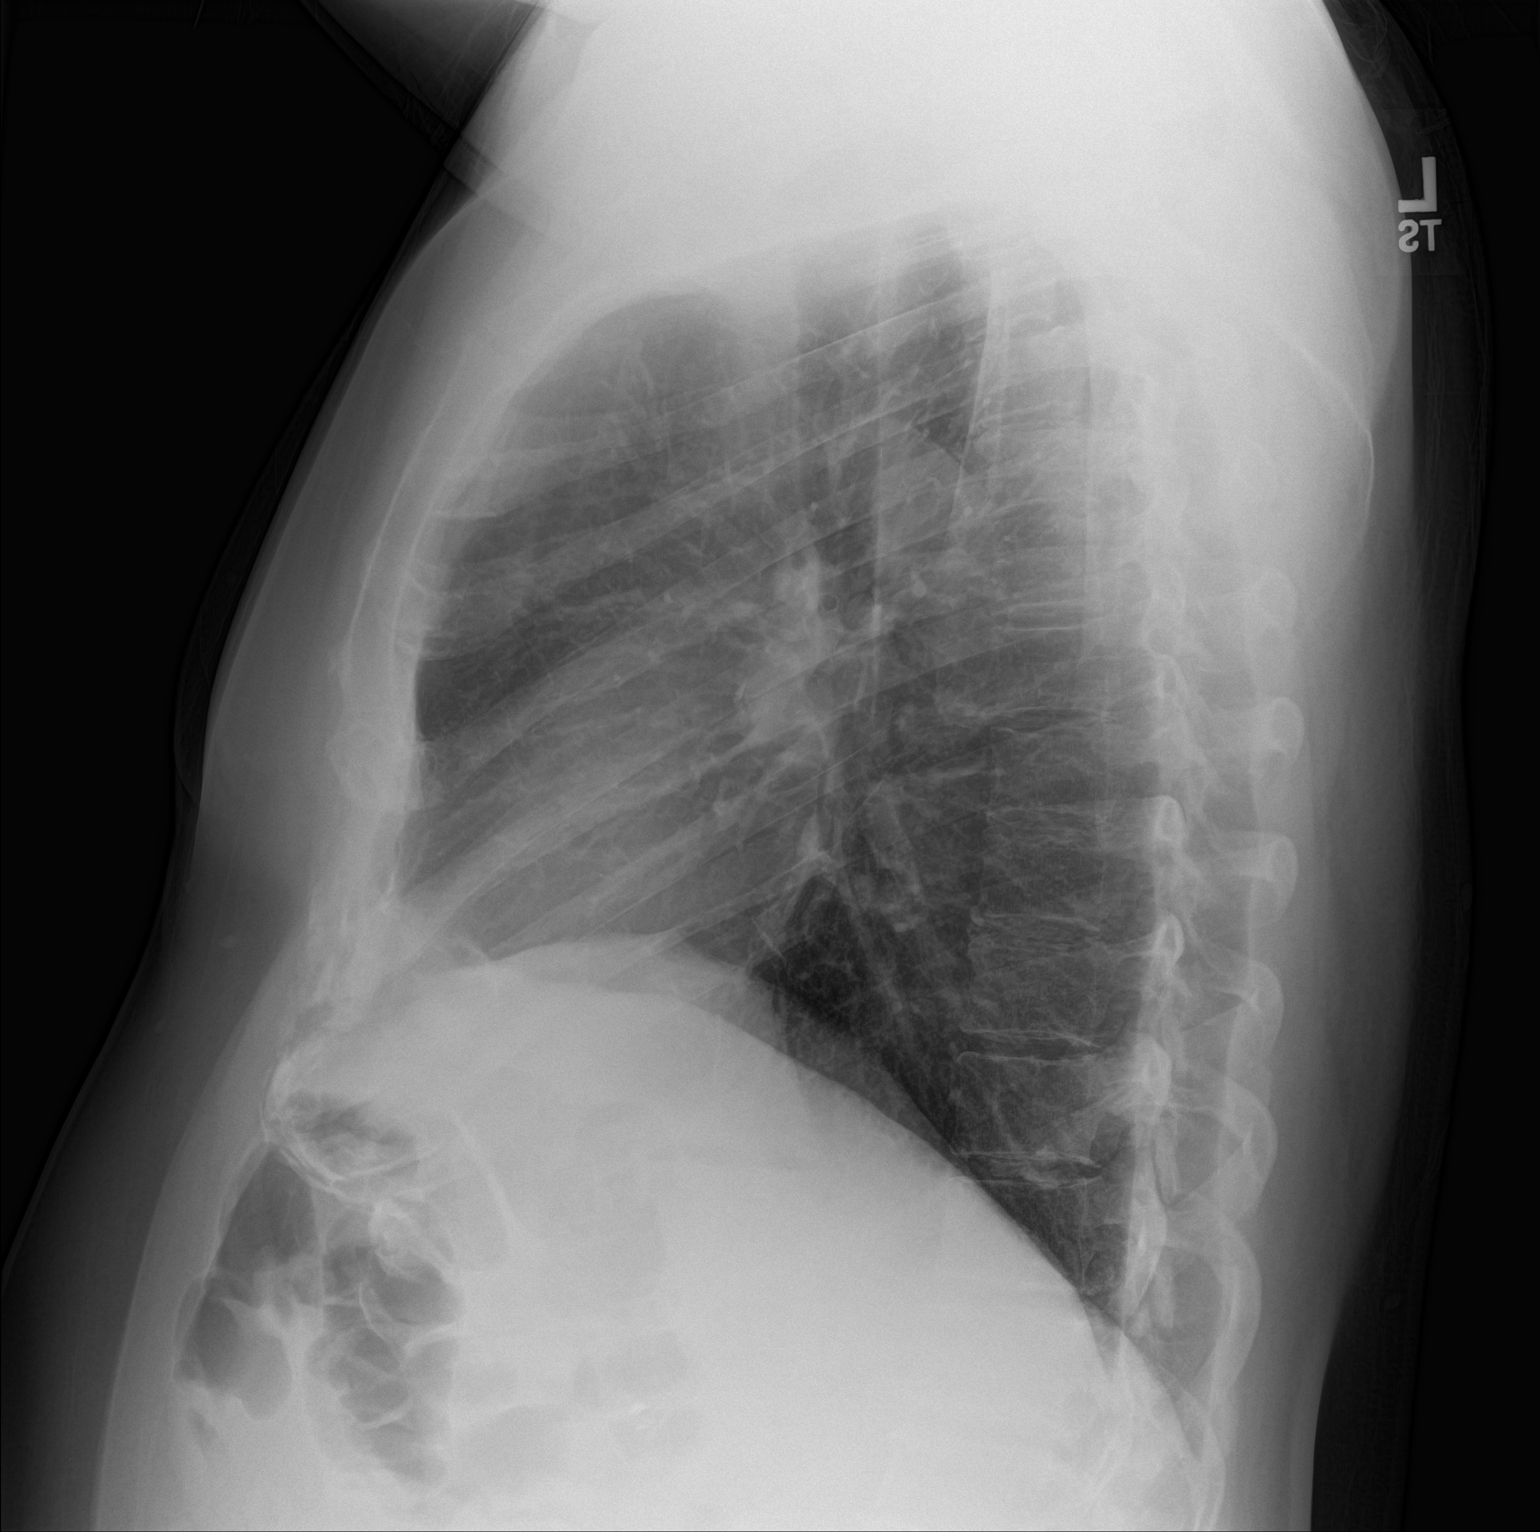

[2 of 2 positions shown; findings below may reference images not displayed]

FINDINGS: The heart size and mediastinal contours are within normal limits.
Both lungs are clear. The visualized skeletal structures are
unremarkable.
IMPRESSION: No active cardiopulmonary disease.

## 2018-04-09 ENCOUNTER — Ambulatory Visit: Payer: BLUE CROSS/BLUE SHIELD | Attending: Neurology

## 2018-04-09 DIAGNOSIS — G4733 Obstructive sleep apnea (adult) (pediatric): Secondary | ICD-10-CM | POA: Insufficient documentation

## 2019-11-16 ENCOUNTER — Emergency Department
Admission: EM | Admit: 2019-11-16 | Discharge: 2019-11-16 | Disposition: A | Discharge status: Home / Self Care | Payer: BC Managed Care – PPO | Attending: Emergency Medicine | Admitting: Emergency Medicine

## 2019-11-16 ENCOUNTER — Other Ambulatory Visit: Payer: Self-pay

## 2019-11-16 ENCOUNTER — Encounter: Payer: Self-pay | Admitting: Emergency Medicine

## 2019-11-16 DIAGNOSIS — Z5321 Procedure and treatment not carried out due to patient leaving prior to being seen by health care provider: Secondary | ICD-10-CM | POA: Diagnosis not present

## 2019-11-16 DIAGNOSIS — R42 Dizziness and giddiness: Secondary | ICD-10-CM | POA: Diagnosis not present

## 2019-11-16 DIAGNOSIS — R1110 Vomiting, unspecified: Secondary | ICD-10-CM | POA: Diagnosis not present

## 2019-11-16 DIAGNOSIS — R197 Diarrhea, unspecified: Secondary | ICD-10-CM | POA: Insufficient documentation

## 2019-11-16 DIAGNOSIS — R531 Weakness: Secondary | ICD-10-CM | POA: Insufficient documentation

## 2019-11-16 LAB — COMPREHENSIVE METABOLIC PANEL
ALT: 37 U/L (ref 0–44)
AST: 25 U/L (ref 15–41)
Albumin: 4.5 g/dL (ref 3.5–5.0)
Alkaline Phosphatase: 70 U/L (ref 38–126)
Anion gap: 7 (ref 5–15)
BUN: 16 mg/dL (ref 6–20)
CO2: 27 mmol/L (ref 22–32)
Calcium: 9 mg/dL (ref 8.9–10.3)
Chloride: 105 mmol/L (ref 98–111)
Creatinine, Ser: 1.07 mg/dL (ref 0.61–1.24)
GFR calc Af Amer: >60 mL/min (ref >60)
GFR calc non Af Amer: >60 mL/min (ref >60)
Glucose, Bld: 116 mg/dL — ABNORMAL HIGH (ref 70–99)
Potassium: 4.4 mmol/L (ref 3.5–5.1)
Sodium: 139 mmol/L (ref 135–145)
Total Bilirubin: 1.2 mg/dL (ref 0.3–1.2)
Total Protein: 8.4 g/dL — ABNORMAL HIGH (ref 6.5–8.1)

## 2019-11-16 LAB — CBC
HCT: 46.1 % (ref 39.0–52.0)
Hemoglobin: 15.9 g/dL (ref 13.0–17.0)
MCH: 30.1 pg (ref 26.0–34.0)
MCHC: 34.5 g/dL (ref 30.0–36.0)
MCV: 87.3 fL (ref 80.0–100.0)
Platelets: 220 10*3/uL (ref 150–400)
RBC: 5.28 MIL/uL (ref 4.22–5.81)
RDW: 12.5 % (ref 11.5–15.5)
WBC: 7.1 10*3/uL (ref 4.0–10.5)
nRBC: 0 % (ref 0.0–0.2)

## 2019-11-16 LAB — LIPASE, BLOOD: Lipase: 35 U/L (ref 11–51)

## 2019-11-16 MED ORDER — ONDANSETRON 4 MG PO TBDP
4.0000 mg | ORAL_TABLET | Freq: Once | ORAL | Status: AC | PRN
  Administered 2019-11-16: 4 mg via ORAL
  Filled 2019-11-16: qty 1

## 2019-11-16 NOTE — ED Triage Notes (Signed)
Patient presents to the ED from [facility].  Patient states his bp at [facility] was 99/74 which is low for him.  Patient reports diarrhea and vomiting since Friday.  Patient states he is also feeling weak and dizzy.  Patient denies any abdominal pain.  Denies loss of taste/smell.

## 2020-12-02 ENCOUNTER — Encounter: Payer: Self-pay | Admitting: *Deleted

## 2020-12-05 ENCOUNTER — Ambulatory Visit: Payer: BC Managed Care – PPO | Admitting: Anesthesiology

## 2020-12-05 ENCOUNTER — Encounter: Payer: Self-pay | Admitting: *Deleted

## 2020-12-05 ENCOUNTER — Ambulatory Visit
Admission: RE | Admit: 2020-12-05 | Discharge: 2020-12-05 | Disposition: A | Discharge status: Home / Self Care | Payer: BC Managed Care – PPO | Attending: Gastroenterology | Admitting: Gastroenterology

## 2020-12-05 ENCOUNTER — Encounter
Admission: RE | Disposition: A | Discharge status: Home / Self Care | Payer: Self-pay | Source: Home / Self Care | Attending: Gastroenterology

## 2020-12-05 DIAGNOSIS — K219 Gastro-esophageal reflux disease without esophagitis: Secondary | ICD-10-CM | POA: Diagnosis not present

## 2020-12-05 DIAGNOSIS — Z9049 Acquired absence of other specified parts of digestive tract: Secondary | ICD-10-CM | POA: Insufficient documentation

## 2020-12-05 DIAGNOSIS — K921 Melena: Secondary | ICD-10-CM | POA: Insufficient documentation

## 2020-12-05 DIAGNOSIS — K2950 Unspecified chronic gastritis without bleeding: Secondary | ICD-10-CM | POA: Diagnosis not present

## 2020-12-05 DIAGNOSIS — K317 Polyp of stomach and duodenum: Secondary | ICD-10-CM | POA: Insufficient documentation

## 2020-12-05 DIAGNOSIS — K5730 Diverticulosis of large intestine without perforation or abscess without bleeding: Secondary | ICD-10-CM | POA: Insufficient documentation

## 2020-12-05 DIAGNOSIS — K648 Other hemorrhoids: Secondary | ICD-10-CM | POA: Insufficient documentation

## 2020-12-05 DIAGNOSIS — Z7951 Long term (current) use of inhaled steroids: Secondary | ICD-10-CM | POA: Insufficient documentation

## 2020-12-05 DIAGNOSIS — K644 Residual hemorrhoidal skin tags: Secondary | ICD-10-CM | POA: Insufficient documentation

## 2020-12-05 DIAGNOSIS — Z79899 Other long term (current) drug therapy: Secondary | ICD-10-CM | POA: Diagnosis not present

## 2020-12-05 DIAGNOSIS — K589 Irritable bowel syndrome without diarrhea: Secondary | ICD-10-CM | POA: Diagnosis not present

## 2020-12-05 DIAGNOSIS — D123 Benign neoplasm of transverse colon: Secondary | ICD-10-CM | POA: Insufficient documentation

## 2020-12-05 DIAGNOSIS — Z880 Allergy status to penicillin: Secondary | ICD-10-CM | POA: Insufficient documentation

## 2020-12-05 HISTORY — DX: Irritable bowel syndrome, unspecified: K58.9

## 2020-12-05 HISTORY — PX: COLONOSCOPY: SHX5424

## 2020-12-05 HISTORY — PX: ESOPHAGOGASTRODUODENOSCOPY: SHX5428

## 2020-12-05 HISTORY — DX: Cardiac arrhythmia, unspecified: I49.9

## 2020-12-05 SURGERY — EGD (ESOPHAGOGASTRODUODENOSCOPY)
Anesthesia: General

## 2020-12-05 MED ORDER — PROPOFOL 10 MG/ML IV BOLUS
INTRAVENOUS | Status: AC
  Filled 2020-12-05: qty 20

## 2020-12-05 MED ORDER — PROPOFOL 500 MG/50ML IV EMUL
INTRAVENOUS | Status: AC
  Filled 2020-12-05: qty 50

## 2020-12-05 MED ORDER — PHENYLEPHRINE HCL (PRESSORS) 10 MG/ML IV SOLN
INTRAVENOUS | Status: DC | PRN
  Administered 2020-12-05 (×6): 50 ug via INTRAVENOUS

## 2020-12-05 MED ORDER — LIDOCAINE HCL (PF) 2 % IJ SOLN
INTRAMUSCULAR | Status: AC
  Filled 2020-12-05: qty 5

## 2020-12-05 MED ORDER — DEXAMETHASONE SODIUM PHOSPHATE 10 MG/ML IJ SOLN
INTRAMUSCULAR | Status: DC | PRN
  Administered 2020-12-05: 5 mg via INTRAVENOUS

## 2020-12-05 MED ORDER — DEXAMETHASONE SODIUM PHOSPHATE 10 MG/ML IJ SOLN
INTRAMUSCULAR | Status: AC
  Filled 2020-12-05: qty 1

## 2020-12-05 MED ORDER — ONDANSETRON HCL 4 MG/2ML IJ SOLN
INTRAMUSCULAR | Status: AC
  Filled 2020-12-05: qty 2

## 2020-12-05 MED ORDER — SODIUM CHLORIDE 0.9 % IV SOLN: INTRAVENOUS | Status: DC

## 2020-12-05 MED ORDER — ONDANSETRON HCL 4 MG/2ML IJ SOLN
INTRAMUSCULAR | Status: DC | PRN
  Administered 2020-12-05: 4 mg via INTRAVENOUS

## 2020-12-05 MED ORDER — PROPOFOL 500 MG/50ML IV EMUL
INTRAVENOUS | Status: DC | PRN
  Administered 2020-12-05: 150 ug/kg/min via INTRAVENOUS

## 2020-12-05 MED ORDER — LIDOCAINE HCL (CARDIAC) PF 100 MG/5ML IV SOSY
PREFILLED_SYRINGE | INTRAVENOUS | Status: DC | PRN
  Administered 2020-12-05: 100 mg via INTRAVENOUS

## 2020-12-05 NOTE — Anesthesia Postprocedure Evaluation (Signed)
Anesthesia Post Note  Patient: Arthur Howe  Procedure(s) Performed: ESOPHAGOGASTRODUODENOSCOPY (EGD) COLONOSCOPY  Patient location during evaluation: PACU Anesthesia Type: General Level of consciousness: awake and alert, oriented and patient cooperative Pain management: pain level controlled Vital Signs Assessment: post-procedure vital signs reviewed and stable Respiratory status: spontaneous breathing, nonlabored ventilation and respiratory function stable Cardiovascular status: blood pressure returned to baseline and stable Postop Assessment: adequate PO intake Anesthetic complications: no   No notable events documented.   Last Vitals:  Vitals:   12/05/20 1140 12/05/20 1150  BP: 129/70 (!) 128/54  Pulse: (!) 106 (!) 102  Resp: (!) 27 (!) 22  Temp:    SpO2: 97% 92%    Last Pain:  Vitals:   12/05/20 1150  TempSrc:   PainSc: 0-No pain                 Darrin Nipper

## 2020-12-05 NOTE — Op Note (Signed)
[facility] Gastroenterology Patient Name: Arthur Howe Procedure Date: 12/05/2020 10:50 AM MRN: [id number] Account #: [id number] Date of Birth: 1966-01-30 Admit Type: Outpatient Age: 55 Room: [facility] ENDO ROOM 2 Gender: Male Note Status: Finalized Instrument Name: Colonoscope [id number] Procedure:             Colonoscopy Indications:           Hematochezia Providers:             Rueben Bash, DO Referring MD:          Youlanda Roys. Lovie Macadamia, MD (Referring MD) Medicines:             Monitored Anesthesia Care Complications:         No immediate complications. Estimated blood loss:                         Minimal. Procedure:             Pre-Anesthesia Assessment:                        - Prior to the procedure, a History and Physical was                         performed, and patient medications and allergies were                         reviewed. The patient is competent. The risks and                         benefits of the procedure and the sedation options and                         risks were discussed with the patient. All questions                         were answered and informed consent was obtained.                         Patient identification and proposed procedure were                         verified by the physician, the nurse, the anesthetist                         and the technician in the endoscopy suite. Mental                         Status Examination: alert and oriented. Airway                         Examination: normal oropharyngeal airway and neck                         mobility. Respiratory Examination: clear to                         auscultation. CV Examination: RRR, no murmurs, no S3  or S4. Prophylactic Antibiotics: The patient does not                         require prophylactic antibiotics. Prior                         Anticoagulants: The patient has taken no previous                          anticoagulant or antiplatelet agents. ASA Grade                         Assessment: II - A patient with mild systemic disease.                         After reviewing the risks and benefits, the patient                         was deemed in satisfactory condition to undergo the                         procedure. The anesthesia plan was to use monitored                         anesthesia care (MAC). Immediately prior to                         administration of medications, the patient was                         re-assessed for adequacy to receive sedatives. The                         heart rate, respiratory rate, oxygen saturations,                         blood pressure, adequacy of pulmonary ventilation, and                         response to care were monitored throughout the                         procedure. The physical status of the patient was                         re-assessed after the procedure.                        After obtaining informed consent, the colonoscope was                         passed under direct vision. Throughout the procedure,                         the patient's blood pressure, pulse, and oxygen                         saturations were monitored continuously. The  Colonoscope was introduced through the anus and                         advanced to the the terminal ileum, with                         identification of the appendiceal orifice and IC                         valve. The colonoscopy was performed without                         difficulty. The patient tolerated the procedure well.                         The quality of the bowel preparation was good. The                         terminal ileum, ileocecal valve, appendiceal orifice,                         and rectum were photographed. Findings:      Hemorrhoids were found on perianal exam.      The digital rectal exam was normal. Pertinent negatives include normal        sphincter tone.      The terminal ileum appeared normal.      A 2 to 3 mm polyp was found in the transverse colon. The polyp was       sessile. The polyp was removed with a cold biopsy forceps. Resection and       retrieval were complete. Estimated blood loss was minimal.      Multiple small and large-mouthed diverticula were found in the right       colon and anastomosis. Multiple diverticuli, near the anastamosis of       partial right hemicolectomy. No signs of inflammation, infection, or       active bleeding. No anastamotic ulcers noted. Diverticuli scattered       proximal and distal to this anastamosis point. Estimated blood loss:       none.      Non-bleeding external and internal hemorrhoids were found during       retroflexion and during perianal exam. The hemorrhoids were       medium-sized. Estimated blood loss: none.      The exam was otherwise without abnormality on direct and retroflexion       views. Impression:            - Hemorrhoids found on perianal exam.                        - The examined portion of the ileum was normal.                        - One 2 to 3 mm polyp in the transverse colon, removed                         with a cold biopsy forceps. Resected and retrieved.                        -  Diverticulosis in the right colon and at the colonic                         anastomosis.                        - Non-bleeding external and internal hemorrhoids.                        - The examination was otherwise normal on direct and                         retroflexion views. Recommendation:        - Discharge patient to home.                        - Resume previous diet.                        - Continue present medications.                        - Await pathology results.                        - Repeat colonoscopy for surveillance based on                         pathology results.                        - Return to my office as previously scheduled.                         - Anusol suppositories for 10 days, twice a day. Procedure Code(s):     --- Professional ---                        [phone], Colonoscopy, flexible; with biopsy, single or                         multiple Diagnosis Code(s):     --- Professional ---                        K64.8, Other hemorrhoids                        K63.5, Polyp of colon                        K92.1, Melena (includes Hematochezia)                        K57.30, Diverticulosis of large intestine without                         perforation or abscess without bleeding CPT copyright 2019 [employer]. All rights reserved. The codes documented in this report are preliminary and upon coder review may  be revised to meet current compliance requirements. Attending Participation:      I personally performed the entire procedure. Volney American, DO Annamaria Helling DO, DO 12/05/2020 11:39:19 AM  This report has been signed electronically. Number of Addenda: 0 Note Initiated On: 12/05/2020 10:50 AM Scope Withdrawal Time: 0 hours 12 minutes 21 seconds  Total Procedure Duration: 0 hours 21 minutes 39 seconds  Estimated Blood Loss:  Estimated blood loss was minimal.      [facility]

## 2020-12-05 NOTE — Transfer of Care (Signed)
Immediate Anesthesia Transfer of Care Note  Patient: Arthur Howe  Procedure(s) Performed: ESOPHAGOGASTRODUODENOSCOPY (EGD) COLONOSCOPY  Patient Location: PACU  Anesthesia Type:General  Level of Consciousness: awake and sedated  Airway & Oxygen Therapy: Patient Spontanous Breathing and Patient connected to face mask oxygen  Post-op Assessment: Report given to RN and Post -op Vital signs reviewed and stable  Post vital signs: Reviewed and stable  Last Vitals:  Vitals Value Taken Time  BP    Temp    Pulse    Resp    SpO2      Last Pain:  Vitals:   12/05/20 1021  TempSrc: Temporal  PainSc: 0-No pain         Complications: No notable events documented.

## 2020-12-05 NOTE — H&P (Signed)
[facility] Gastroenterology Pre-Procedure H&P   Patient ID: Arthur Howe is a 55 y.o. male.  Gastroenterology Provider: Annamaria Helling, DO  PCP: Juluis Pitch, MD  Date: 12/05/2020  HPI [name] is a 55 y.o. male who presents today for Esophagogastroduodenoscopy and Colonoscopy for melena and bright red blood per rectum.  Patient's hemoglobin is stable. Notes decreased appetite w/o weight loss. Dizziness with the episode.  Still having intermittent bleeding episodes since office visit. No pain associated.  H/o partial colectomy 2/2 recurrent diverticulitis (2011). Colonoscopy in 2019 with two TA. 2000 and 2011 normal aside from [facility] and diverticulosis.  No other acute GI complaints.   Past Medical History:  Diagnosis Date   Anxiety    Asthma    Conversion disorder    Depression    Diverticulitis    Dysrhythmia    GERD (gastroesophageal reflux disease)    Hyperlipidemia    Hypertension    IBS (irritable bowel syndrome)    Migraine    Plantar fascial fibromatosis    Sleep apnea    Syncope and collapse     Past Surgical History:  Procedure Laterality Date   COLON SURGERY     COLONOSCOPY WITH PROPOFOL N/A 10/14/2017   Procedure: COLONOSCOPY WITH PROPOFOL;  Surgeon: Manya Silvas, MD;  Location[facility] ENDOSCOPY;  Service: Endoscopy;  Laterality: N/A;   GALLBLADDER SURGERY     URETHRA SURGERY      Family History No h/o GI disease or malignancy  Review of Systems  Constitutional:  Positive for appetite change (decreased). Negative for activity change, chills, fatigue, fever and unexpected weight change.  HENT:  Negative for trouble swallowing and voice change.   Respiratory:  Negative for shortness of breath.   Cardiovascular:  Negative for chest pain and palpitations.  Gastrointestinal:  Positive for blood in stool (melena and BRBPR) and nausea. Negative for abdominal distention, abdominal pain, anal bleeding, constipation, diarrhea and vomiting.   Musculoskeletal:  Negative for arthralgias and myalgias.  Skin:  Negative for color change and pallor.  Neurological:  Positive for dizziness. Negative for syncope and weakness.  Psychiatric/Behavioral:  Negative for confusion. The patient is not nervous/anxious.   All other systems reviewed and are negative.   Medications No current facility-administered medications on file prior to encounter.   Current Outpatient Medications on File Prior to Encounter  Medication Sig Dispense Refill   buPROPion (WELLBUTRIN XL) 150 MG 24 hr tablet Take 150 mg by mouth daily.     clonazePAM (KLONOPIN) 1 MG tablet Take 1 tablet (1 mg total) by mouth 2 (two) times daily as needed (anxiety). 60 tablet 0   metoprolol succinate (TOPROL-XL) 25 MG 24 hr tablet Take 25 mg by mouth daily.     prazosin (MINIPRESS) 2 MG capsule Take 1 capsule (2 mg total) by mouth at bedtime. 30 capsule 0   albuterol (PROVENTIL HFA;VENTOLIN HFA) 108 (90 BASE) MCG/ACT inhaler Inhale 2 puffs into the lungs every 6 (six) hours as needed for wheezing or shortness of breath.      citalopram (CELEXA) 40 MG tablet Take 40 mg by mouth daily.     cyclobenzaprine (FLEXERIL) 5 MG tablet Take 5 mg by mouth 3 (three) times daily as needed for muscle spasms.     fexofenadine (ALLEGRA) 180 MG tablet Take 180 mg by mouth daily.     fluticasone (FLONASE) 50 MCG/ACT nasal spray Place 2 sprays into both nostrils every 12 (twelve) hours.     fluticasone-salmeterol (ADVAIR  HFA) 115-21 MCG/ACT inhaler Inhale 2 puffs into the lungs 2 (two) times daily.     fluvoxaMINE (LUVOX) 100 MG tablet Take 1 tablet (100 mg total) by mouth at bedtime. 30 tablet 1   gabapentin (NEURONTIN) 100 MG capsule Take 100 mg by mouth daily.     guanFACINE (TENEX) 1 MG tablet Take 1 mg by mouth daily.     ibuprofen (ADVIL,MOTRIN) 400 MG tablet Take 400 mg by mouth every 6 (six) hours as needed.     losartan (COZAAR) 100 MG tablet Take 100 mg by mouth at bedtime.      Multiple  Vitamin (MULTIVITAMIN) tablet Take 1 tablet by mouth daily.     pantoprazole (PROTONIX) 40 MG tablet Take 40 mg by mouth daily.      risperiDONE (RISPERDAL) 1 MG tablet Take 2 tablets (2 mg total) by mouth at bedtime. 30 tablet 0   SUMAtriptan (IMITREX) 50 MG tablet Take 50 mg by mouth every 2 (two) hours as needed for migraine.   1   topiramate (TOPAMAX) 50 MG tablet Take 50 mg by mouth 2 (two) times daily.      traZODone (DESYREL) 100 MG tablet Take 1 tablet (100 mg total) by mouth at bedtime. 30 tablet 0    Pertinent medications related to GI and procedure were reviewed by me with the patient prior to the procedure   Current Facility-Administered Medications:    0.9 %  sodium chloride infusion, , Intravenous, Continuous, Millie, Shorb, DO, Last Rate: 20 mL/hr at 12/05/20 1035, New Bag at 12/05/20 1035      Allergies  Allergen Reactions   Amoxicillin Anaphylaxis and Other (See Comments)    Has patient had a PCN reaction causing immediate rash, facial/tongue/throat swelling, SOB or lightheadedness with hypotension: Yes Has patient had a PCN reaction causing severe rash involving mucus membranes or skin necrosis: No Has patient had a PCN reaction that required hospitalization No Has patient had a PCN reaction occurring within the last 10 years: No If all of the above answers are "NO", then may proceed with Cephalosporin use.   Penicillins Anaphylaxis and Other (See Comments)    Has patient had a PCN reaction causing immediate rash, facial/tongue/throat swelling, SOB or lightheadedness with hypotension: Yes Has patient had a PCN reaction causing severe rash involving mucus membranes or skin necrosis: No Has patient had a PCN reaction that required hospitalization No Has patient had a PCN reaction occurring within the last 10 years: No If all of the above answers are "NO", then may proceed with Cephalosporin use.   Allergies were reviewed by me prior to the procedure  Objective     Vitals:   12/05/20 1021  BP: (!) 136/93  Pulse: 96  Resp: 18  Temp: (!) 97.3 F (36.3 C)  TempSrc: Temporal  SpO2: 96%  Weight: 118.8 kg  Height: 6' (1.829 m)     Physical Exam Vitals and nursing note reviewed.  Constitutional:      General: He is not in acute distress.    Appearance: Normal appearance. He is not ill-appearing, toxic-appearing or diaphoretic.  HENT:     Head: Normocephalic and atraumatic.     Nose: Nose normal.     Mouth/Throat:     Mouth: Mucous membranes are moist.     Pharynx: Oropharynx is clear.  Eyes:     General: No scleral icterus.    Extraocular Movements: Extraocular movements intact.  Cardiovascular:     Rate and Rhythm: Normal  rate and regular rhythm.     Heart sounds: Normal heart sounds. No murmur heard.   No friction rub. No gallop.  Pulmonary:     Effort: Pulmonary effort is normal. No respiratory distress.     Breath sounds: Normal breath sounds. No wheezing, rhonchi or rales.  Abdominal:     General: Bowel sounds are normal. There is no distension.     Palpations: Abdomen is soft.     Tenderness: There is no abdominal tenderness. There is no guarding or rebound.     Comments: Surgical scars - intact  Musculoskeletal:     Cervical back: Neck supple.     Right lower leg: No edema.     Left lower leg: No edema.  Skin:    General: Skin is warm and dry.     Coloration: Skin is not jaundiced or pale.  Neurological:     General: No focal deficit present.     Mental Status: He is alert and oriented to person, place, and time. Mental status is at baseline.  Psychiatric:        Mood and Affect: Mood normal.        Behavior: Behavior normal.        Thought Content: Thought content normal.        Judgment: Judgment normal.     Assessment:  [name] is a 55 y.o. male  who presents today for Esophagogastroduodenoscopy and Colonoscopy for melena and bright red blood per rectum.  Plan:  Esophagogastroduodenoscopy and  Colonoscopy with possible intervention today  Esophagogastroduodenoscopy and colonoscopy with possible biopsy, control of bleeding, polypectomy, and interventions as necessary has been discussed with the patient/patient representative. Informed consent was obtained from the patient/patient representative after explaining the indication, nature, and risks of the procedure including but not limited to death, bleeding, perforation, missed neoplasm/lesions, cardiorespiratory compromise, and reaction to medications. Opportunity for questions was given and appropriate answers were provided. Patient/patient representative has verbalized understanding is amenable to undergoing the procedure.   Annamaria Helling, DO  [facility] Gastroenterology  Portions of the record may have been created with voice recognition software. Occasional wrong-word or 'sound-a-like' substitutions may have occurred due to the inherent limitations of voice recognition software.  Read the chart carefully and recognize, using context, where substitutions may have occurred.

## 2020-12-05 NOTE — Anesthesia Procedure Notes (Signed)
Date/Time: 12/05/2020 11:02 AM Performed by: Vaughan Sine Pre-anesthesia Checklist: Patient identified, Emergency Drugs available, Suction available, Patient being monitored and Timeout performed Patient Re-evaluated:Patient Re-evaluated prior to induction Oxygen Delivery Method: Supernova nasal CPAP Preoxygenation: Pre-oxygenation with 100% oxygen Induction Type: IV induction Airway Equipment and Method: Bite block Placement Confirmation: CO2 detector and positive ETCO2

## 2020-12-05 NOTE — Interval H&P Note (Signed)
History and Physical Interval Note: Preprocedure H&P from 12/05/20  was reviewed and there was no interval change after seeing and examining the patient.  Written consent was obtained from the patient after discussion of risks, benefits, and alternatives. Patient has consented to proceed with Esophagogastroduodenoscopy and Colonoscopy with possible intervention   12/05/2020 10:43 AM  Arthur Howe  has presented today for surgery, with the diagnosis of Hematochezia (K92.1) Melena (K92.1).  The various methods of treatment have been discussed with the patient and family. After consideration of risks, benefits and other options for treatment, the patient has consented to  Procedure(s) with comments: ESOPHAGOGASTRODUODENOSCOPY (EGD) (N/A) - PER OFFICE - LAST AM PROCEDURE COLONOSCOPY (N/A) as a surgical intervention.  The patient's history has been reviewed, patient examined, no change in status, stable for surgery.  I have reviewed the patient's chart and labs.  Questions were answered to the patient's satisfaction.     Annamaria Helling

## 2020-12-05 NOTE — Anesthesia Preprocedure Evaluation (Addendum)
Anesthesia Evaluation  Patient identified by MRN, date of birth, ID band Patient awake    Reviewed: Allergy & Precautions, NPO status , Patient's Chart, lab work & pertinent test results  History of Anesthesia Complications (+) PONV and history of anesthetic complications  Airway Mallampati: IV   Neck ROM: Full    Dental   Missing few molars:   Pulmonary asthma , sleep apnea and Continuous Positive Airway Pressure Ventilation ,    Pulmonary exam normal breath sounds clear to auscultation       Cardiovascular hypertension, Normal cardiovascular exam Rhythm:Regular Rate:Normal  ECG 05/16/20:  Normal sinus rhythm  Right bundle branch block  Exercise treadmill test 08/26/20: Indeterminate treadmill EKG due to baseline EKG changes. Normal myocardial perfusion without evidence of myocardial ischemia EF 60%  Echo 08/04/20:  NORMAL LEFT VENTRICULAR SYSTOLIC FUNCTION  WITH MILD LVH NORMAL RIGHT VENTRICULAR SYSTOLIC FUNCTION NO VALVULAR STENOSIS TRIVIAL MR, TR EF >55%   Neuro/Psych  Headaches, Seizures -,  PSYCHIATRIC DISORDERS Anxiety Depression    GI/Hepatic GERD  ,  Endo/Other  Obesity   Renal/GU negative Renal ROS     Musculoskeletal   Abdominal   Peds  Hematology negative hematology ROS (+)   Anesthesia Other Findings   Reproductive/Obstetrics                            Anesthesia Physical Anesthesia Plan  ASA: 2  Anesthesia Plan: General   Post-op Pain Management:    Induction: Intravenous  PONV Risk Score and Plan: 3 and Propofol infusion, TIVA and Treatment may vary due to age or medical condition  Airway Management Planned: Natural Airway  Additional Equipment:   Intra-op Plan:   Post-operative Plan:   Informed Consent: I have reviewed the patients History and Physical, chart, labs and discussed the procedure including the risks, benefits and alternatives for the  proposed anesthesia with the patient or authorized representative who has indicated his/her understanding and acceptance.       Plan Discussed with: CRNA  Anesthesia Plan Comments:        Anesthesia Quick Evaluation

## 2020-12-05 NOTE — Op Note (Signed)
[facility] Gastroenterology Patient Name: Arthur Howe Procedure Date: 12/05/2020 10:51 AM MRN: [id number] Account #: [id number] Date of Birth: 31-Mar-1965 Admit Type: Outpatient Age: 55 Room: [facility] ENDO ROOM 2 Gender: Male Note Status: Finalized Instrument Name: Upper Endoscope [id number] Procedure:             Upper GI endoscopy Indications:           Melena Providers:             Rueben Bash, DO Referring MD:          Youlanda Roys. Lovie Macadamia, MD (Referring MD) Medicines:             Monitored Anesthesia Care Complications:         No immediate complications. Estimated blood loss:                         Minimal. Procedure:             Pre-Anesthesia Assessment:                        - Prior to the procedure, a History and Physical was                         performed, and patient medications and allergies were                         reviewed. The patient is competent. The risks and                         benefits of the procedure and the sedation options and                         risks were discussed with the patient. All questions                         were answered and informed consent was obtained.                         Patient identification and proposed procedure were                         verified by the physician, the nurse, the anesthetist                         and the technician in the endoscopy suite. Mental                         Status Examination: alert and oriented. Airway                         Examination: normal oropharyngeal airway and neck                         mobility. Respiratory Examination: clear to                         auscultation. CV Examination: RRR, no murmurs, no S3  or S4. Prophylactic Antibiotics: The patient does not                         require prophylactic antibiotics. Prior                         Anticoagulants: The patient has taken no previous                          anticoagulant or antiplatelet agents. ASA Grade                         Assessment: II - A patient with mild systemic disease.                         After reviewing the risks and benefits, the patient                         was deemed in satisfactory condition to undergo the                         procedure. The anesthesia plan was to use monitored                         anesthesia care (MAC). Immediately prior to                         administration of medications, the patient was                         re-assessed for adequacy to receive sedatives. The                         heart rate, respiratory rate, oxygen saturations,                         blood pressure, adequacy of pulmonary ventilation, and                         response to care were monitored throughout the                         procedure. The physical status of the patient was                         re-assessed after the procedure.                        After obtaining informed consent, the endoscope was                         passed under direct vision. Throughout the procedure,                         the patient's blood pressure, pulse, and oxygen                         saturations were monitored continuously. The Endoscope  was introduced through the mouth, and advanced to the                         second part of duodenum. The upper GI endoscopy was                         accomplished without difficulty. The patient tolerated                         the procedure well. Findings:      The duodenal bulb, first portion of the duodenum and second portion of       the duodenum were normal. Estimated blood loss: none.      A single 2 to 3 mm fundic-gland appearing polyp with no bleeding and no       stigmata of recent bleeding was found in the gastric antrum. Estimated       blood loss: none.      Normal mucosa was found in the entire examined stomach. Biopsies were       taken with  a cold forceps for Helicobacter pylori testing. Estimated       blood loss was minimal.      The Z-line was regular and was found 40 cm from the incisors. Estimated       blood loss: none.      Esophagogastric landmarks were identified: the gastroesophageal junction       was found at 40 cm from the incisors.      Normal mucosa was found in the entire esophagus.      The exam was otherwise without abnormality. Impression:            - Normal duodenal bulb, first portion of the duodenum                         and second portion of the duodenum.                        - A single gastric polyp.                        - Normal mucosa was found in the entire stomach.                         Biopsied.                        - Z-line regular, 40 cm from the incisors.                        - Esophagogastric landmarks identified.                        - Normal mucosa was found in the entire esophagus.                        - The examination was otherwise normal. Recommendation:        - Discharge patient to home.                        - Resume previous diet.                        -  Continue present medications.                        - Await pathology results.                        - Return to GI office as previously scheduled. Procedure Code(s):     --- Professional ---                        [phone], Esophagogastroduodenoscopy, flexible,                         transoral; with biopsy, single or multiple Diagnosis Code(s):     --- Professional ---                        K31.7, Polyp of stomach and duodenum                        K92.1, Melena (includes Hematochezia) CPT copyright 2019 American Medical Association. All rights reserved. The codes documented in this report are preliminary and upon coder review may  be revised to meet current compliance requirements. Attending Participation:      I personally performed the entire procedure. Volney American, DO Annamaria Helling DO,  DO 12/05/2020 11:32:09 AM This report has been signed electronically. Number of Addenda: 0 Note Initiated On: 12/05/2020 10:51 AM Estimated Blood Loss:  Estimated blood loss was minimal.      [facility]

## 2020-12-06 ENCOUNTER — Encounter: Payer: Self-pay | Admitting: Gastroenterology

## 2020-12-06 LAB — SURGICAL PATHOLOGY

## 2021-03-21 ENCOUNTER — Other Ambulatory Visit: Payer: Self-pay

## 2021-03-21 ENCOUNTER — Emergency Department
Admission: EM | Admit: 2021-03-21 | Discharge: 2021-03-21 | Disposition: A | Discharge status: Home / Self Care | Payer: BC Managed Care – PPO | Attending: Student in an Organized Health Care Education/Training Program | Admitting: Student in an Organized Health Care Education/Training Program

## 2021-03-21 ENCOUNTER — Emergency Department: Payer: BC Managed Care – PPO

## 2021-03-21 ENCOUNTER — Encounter: Payer: Self-pay | Admitting: Emergency Medicine

## 2021-03-21 DIAGNOSIS — Z20822 Contact with and (suspected) exposure to covid-19: Secondary | ICD-10-CM | POA: Insufficient documentation

## 2021-03-21 DIAGNOSIS — R519 Headache, unspecified: Secondary | ICD-10-CM | POA: Diagnosis present

## 2021-03-21 DIAGNOSIS — H53149 Visual discomfort, unspecified: Secondary | ICD-10-CM | POA: Diagnosis not present

## 2021-03-21 LAB — CBC
HCT: 44.2 % (ref 39.0–52.0)
Hemoglobin: 15 g/dL (ref 13.0–17.0)
MCH: 29.9 pg (ref 26.0–34.0)
MCHC: 33.9 g/dL (ref 30.0–36.0)
MCV: 88 fL (ref 80.0–100.0)
Platelets: 196 10*3/uL (ref 150–400)
RBC: 5.02 MIL/uL (ref 4.22–5.81)
RDW: 12.4 % (ref 11.5–15.5)
WBC: 9 10*3/uL (ref 4.0–10.5)
nRBC: 0 % (ref 0.0–0.2)

## 2021-03-21 LAB — BASIC METABOLIC PANEL
Anion gap: 7 (ref 5–15)
BUN: 15 mg/dL (ref 6–20)
CO2: 28 mmol/L (ref 22–32)
Calcium: 8.9 mg/dL (ref 8.9–10.3)
Chloride: 105 mmol/L (ref 98–111)
Creatinine, Ser: 1.14 mg/dL (ref 0.61–1.24)
GFR, Estimated: >60 mL/min (ref >60)
Glucose, Bld: 134 mg/dL — ABNORMAL HIGH (ref 70–99)
Potassium: 3.7 mmol/L (ref 3.5–5.1)
Sodium: 140 mmol/L (ref 135–145)

## 2021-03-21 LAB — RESP PANEL BY RT-PCR (FLU A&B, COVID) ARPGX2
Influenza A by PCR: NEGATIVE
Influenza B by PCR: NEGATIVE
SARS Coronavirus 2 by RT PCR: NEGATIVE

## 2021-03-21 MED ORDER — SODIUM CHLORIDE 0.9 % IV BOLUS
500.0000 mL | Freq: Once | INTRAVENOUS | Status: AC
  Administered 2021-03-21: 500 mL via INTRAVENOUS

## 2021-03-21 MED ORDER — DEXAMETHASONE SODIUM PHOSPHATE 10 MG/ML IJ SOLN
10.0000 mg | Freq: Once | INTRAMUSCULAR | Status: AC
  Administered 2021-03-21: 10 mg via INTRAVENOUS
  Filled 2021-03-21: qty 1

## 2021-03-21 MED ORDER — PROCHLORPERAZINE EDISYLATE 10 MG/2ML IJ SOLN
10.0000 mg | Freq: Once | INTRAMUSCULAR | Status: AC
  Administered 2021-03-21: 10 mg via INTRAVENOUS
  Filled 2021-03-21: qty 2

## 2021-03-21 MED ORDER — DIPHENHYDRAMINE HCL 50 MG/ML IJ SOLN
12.5000 mg | Freq: Once | INTRAMUSCULAR | Status: AC
  Administered 2021-03-21: 12.5 mg via INTRAVENOUS
  Filled 2021-03-21: qty 1

## 2021-03-21 NOTE — Discharge Instructions (Signed)

## 2021-03-21 NOTE — ED Provider Notes (Signed)
[facility] Provider Note    Event Date/Time   First MD Initiated Contact with Patient 03/21/21 1604     (approximate)   History   Headache   HPI  Arthur Howe is a 56 y.o. male with a history of chronic migraine presents to the ER for 3 days of migraine since 3 days ago his headache did come on fashion and usual.  He is taking his preventative medications.  He took a sumatriptan with improvement in symptoms presents with acute on coming back a few hours later.  Denies any numbness or tingling no neck stiffness no fevers has had some nasal congestion no vomiting.  Has had photophobia as well as phonophobia.  States that he called his neurologist office and they directed to the ER.     Physical Exam   Triage Vital Signs: ED Triage Vitals  Enc Vitals Group     BP 03/21/21 1557 (!) 151/90     Pulse Rate 03/21/21 1557 73     Resp 03/21/21 1557 20     Temp 03/21/21 1557 98.1 F (36.7 C)     Temp Source 03/21/21 1557 Oral     SpO2 03/21/21 1557 96 %     Weight 03/21/21 1558 267 lb (121.1 kg)     Height 03/21/21 1558 6' (1.829 m)     Head Circumference --      Peak Flow --      Pain Score 03/21/21 1558 3     Pain Loc --      Pain Edu? --      Excl. in [place]? --     Most recent vital signs: Vitals:   03/21/21 1730 03/21/21 1745  BP: 125/79   Pulse: (!) 59 60  Resp: 20   Temp:    SpO2: 98% 97%     Constitutional: Alert  Eyes: Conjunctivae are normal.  Head: Atraumatic. Nose: No congestion/rhinnorhea. Mouth/Throat: Mucous membranes are moist.   Neck: Painless ROM.  Cardiovascular:   Good peripheral circulation. Respiratory: Normal respiratory effort.  No retractions.  Gastrointestinal: Soft and nontender.  Musculoskeletal:  no deformity Neurologic:  CN- intact.  No facial droop, Sensation intact bilaterally. Normal speech and language. No gross focal neurologic deficits are appreciated. No gait instability. Skin:  Skin is warm, dry and  intact. No rash noted. Psychiatric: Mood and affect are normal. Speech and behavior are normal.    ED Results / Procedures / Treatments   Labs (all labs ordered are listed, but only abnormal results are displayed) Labs Reviewed  BASIC METABOLIC PANEL - Abnormal; Notable for the following components:      Result Value   Glucose, Bld 134 (*)    All other components within normal limits  RESP PANEL BY RT-PCR (FLU A&B, COVID) ARPGX2  CBC     EKG     RADIOLOGY Please see ED Course for my review and interpretation.  I personally reviewed all radiographic images ordered to evaluate for the above acute complaints and reviewed radiology reports and findings.  These findings were personally discussed with the patient.  Please see medical record for radiology report.    PROCEDURES:  Critical Care performed: No  Procedures   MEDICATIONS ORDERED IN ED: Medications  dexamethasone (DECADRON) injection 10 mg (has no administration in time range)  prochlorperazine (COMPAZINE) injection 10 mg (10 mg Intravenous Given 03/21/21 1649)  diphenhydrAMINE (BENADRYL) injection 12.5 mg (12.5 mg Intravenous Given 03/21/21 1649)  sodium chloride 0.9 %  bolus 500 mL (500 mLs Intravenous New Bag/Given 03/21/21 1654)     IMPRESSION / MDM / ASSESSMENT AND PLAN / ED COURSE  I reviewed the triage vital signs and the nursing notes.                              Differential diagnosis includes, but is not limited to, tension, migraine, status migrainosis, doubt, sah, iph, sdh, mass  Patient with a history of migraines presenting to the ER for evaluation of migraine headache change in character presents with 3 days of headache which is never happened before.  He is nontoxic no meningismus no fevers.  Not consistent encephalitis or meningitis.  Discussed options elected to proceed with CT imaging.  Does not seem consistent with subarachnoid IPH or subdural.  Will give migraine cocktail with IV Compazine as  well as IV Benadryl.  Will check basic blood work.   Clinical Course as of 03/21/21 1808  Tue Mar 21, 2021  1640 My review of head CT did not see any evidence of bleed or mass.  Will await formal read. [PR]  9735 Is reassuring.  COVID and flu negative.  Patient feels improved.  Does appear appropriate for outpatient follow-up with neurology.  Discussed return precautions.  Patient agreeable to plan. [PR]    Clinical Course User Index [PR] Merlyn Lot, MD     FINAL CLINICAL IMPRESSION(S) / ED DIAGNOSES   Final diagnoses:  Bad headache     Rx / DC Orders   ED Discharge Orders     None        Note:  This document was prepared using Dragon voice recognition software and may include unintentional dictation errors.    Merlyn Lot, MD 03/21/21 [phone]

## 2021-03-21 NOTE — ED Triage Notes (Signed)
Pt to ED via POV with c/o a headache for three days, he called his neurologist who told him to come here for a work up to see why he is having headaches. He has taken his preventive medication, he states that it will go away then come back.

## 2021-10-04 ENCOUNTER — Ambulatory Visit (HOSPITAL_COMMUNITY)
Admission: EM | Admit: 2021-10-04 | Discharge: 2021-10-04 | Disposition: A | Discharge status: Home / Self Care | Payer: BC Managed Care – PPO

## 2021-10-04 DIAGNOSIS — F32A Depression, unspecified: Secondary | ICD-10-CM | POA: Diagnosis not present

## 2021-10-04 NOTE — ED Provider Notes (Signed)
Behavioral Health Urgent Care Medical Screening Exam  Patient Name: Arthur Howe MRN: [id number] Date of Evaluation: 10/04/21 Chief Complaint:   Diagnosis:  Final diagnoses:  Depression, unspecified depression type   History of Present illness: Arthur Howe is a 56 y.o. male. Pt presents voluntarily to [facility] behavioral health for walk-in assessment.  Pt is accompanied by his wife, Mickel Baas, who remains w/ pt throughout the assessment. Pt is assessed face-to-face by nurse practitioner.   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 Per chart review, hx of anxiety, conversion disorder, depression.  Pt reports he is presenting to this facility today due to referral from Dr. Darleene Cleaver following his virtual appointment at [facility]. States he reported experiencing suicidal ideation yesterday and was told to come to this facility for an evaluation.   Pt reports he is currently feeling depressed. Per pt, he is currently unemployed and on disability. He lives with his mother-in-law, wife, and 2 children (53 y/o and 34 y/o). He states he usually spends time at home with his mother-in-law. His mother-in-law has been busy taking care of her brother for the past couple of weeks and he has been home alone. Pt states he does not like being home alone. When home alone, pt experiences poor appetite. Pt states that yesterday he did have 2 meals since his children were home. Pt reports poor sleep, although he is not sure whether this is related to feelings of depression or due to OSA dx. He states he has a BIPAP to sleep. He endorses sleeping about 6 hours/night, although feels that sleep is on and off and is not good quality.   Pt states that yesterday he experienced suicidal ideation w/ thoughts "I'd be better off dead", "family would be better off without me". He denies he had a plan or intent to act on a plan. He denies he is currently  experiencing suicidal ideation. Per pt, suicidal ideation tend to come when he is alone.   Pt denies hx of/current homicidal or violent ideations.   Pt denies current auditory visual hallucinations. He reports he does experience auditory hallucinations at times. He last experienced auditory hallucinations yesterday of his own voice telling him "I'd be better off dead", "family would be better off without me".   He reports hx of NSSIB, cutting himself w/ a scissor, last occurring when he was 56 y/o. He denies hx of SA. He reports hx of 2 inpatient psychiatric hospitalizations. He reports he believes first inpatient psychiatric hospitalization was in 2017 and his second inpatient psychiatric hospitalization was shortly after. Per chart review, pt was hospitalized at [facility] from 06/13/15-06/20/15; and transferred to [facility] on 12/19/15 after presenting to [facility] on 12/17/15.   He reports family psychiatric hx is positive. He states his father carries dx of bipolar disorder.   He reports he is receiving medication management from [facility]. He states he is not currently receiving counseling. He is interested in counseling.   Pt's highest level of education is some college.   He denies access to a firearm or a firearm in the home.  He denies alcohol, marijuana, crack/cocaine, other substance use.   Discussed w/ pt referral to PHP/IOP program. Pt declines at this time, states he may be interested in the future. Pt is interested in outpatient counseling. Discussed will consult w/ LCSW to provide resources. Pt is in agreement w/ plan for discharge w/ follow up w/  provided resources. Pt easily verbally contracts to safety for himself and others. Pt states that he is has been working with Dr. Darleene Cleaver since 2016 and will like to continue medication management w/ him. Pt's wife Mickel Baas denies any safety concerns w/ pt discharge today. She states that she can take time off of  work to stay with pt for the next couple of days and to help him get set up with services. Discussed w/ pt and Mickel Baas that they may also call pt's insurance company to get a list of in-network providers. Pt and Mickel Baas verbalized understanding. Pt and Mickel Baas agree if there are any safety concerns pt will come back to this facility, call 911/EMS, or go to the nearest emergency room.   Pt is a&ox3, in no acute distress, non-toxic appearing. He appears casually dressed, fairly groomed, appropriate for environment. He makes good eye contact. Speech is clear and coherent w/ nml rate and volume. Reported mood is depressed. Affect is blunt. TP is coherent, goal directed, linear. Description of associations is intact. TC is logical. There is no evidence he is responding to internal stimuli. There is no evidence of agitation, aggression or distractibility. No delusions or paranoia elicited during assessment. Pt is calm, cooperative, and pleasant.   Psychiatric Specialty Exam  Presentation  General Appearance:Appropriate for Environment; Casual; Fairly Groomed  Eye Contact:Good  Speech:Clear and Coherent; Normal Rate  Speech Volume:Normal  Handedness:No data recorded  Mood and Affect  Mood:Depressed  Affect:Blunt   Thought Process  Thought Processes:Coherent; Goal Directed; Linear  Descriptions of Associations:Intact  Orientation:Full (Time, Place and Person)  Thought Content:Logical    Hallucinations:None  Ideas of Reference:None  Suicidal Thoughts:No  Homicidal Thoughts:No   Sensorium  Memory:Immediate Good  Judgment:Good  Insight:Good   Executive Functions  Concentration:Good  Attention Span:Good  [place] of Knowledge:Good  Language:Good   Psychomotor Activity  Psychomotor Activity:Normal   Assets  Assets:No data recorded  Sleep  Sleep:No data recorded Number of hours: No data recorded  No data recorded  Physical Exam: Physical  Exam Cardiovascular:     Rate and Rhythm: Normal rate.  Pulmonary:     Effort: Pulmonary effort is normal.  Neurological:     Mental Status: He is alert and oriented to person, place, and time.  Psychiatric:        Attention and Perception: Attention and perception normal.        Mood and Affect: Mood is depressed. Affect is blunt.        Speech: Speech normal.        Behavior: Behavior normal. Behavior is cooperative.        Thought Content: Thought content normal.    Review of Systems  Constitutional:  Negative for chills and fever.  Respiratory:  Negative for shortness of breath.   Cardiovascular:  Negative for chest pain and palpitations.  Gastrointestinal:  Negative for abdominal pain.  Neurological:  Negative for headaches.  Psychiatric/Behavioral:  Positive for depression.    Blood pressure (!) 137/100, pulse 100, temperature 97.8 F (36.6 C), temperature source Oral, resp. rate 18, SpO2 95 %. There is no height or weight on file to calculate BMI.  Musculoskeletal: Strength & Muscle Tone: within normal limits Gait & Station: normal Patient leans: N/A  [place] MSE Discharge Disposition for Follow up and Recommendations: Based on my evaluation the patient does not appear to have an emergency medical condition and can be discharged with resources and follow up care in outpatient services for Medication  Management and Individual Therapy  Tharon Aquas, NP 10/04/2021, 6:11 PM

## 2021-10-04 NOTE — Discharge Instructions (Addendum)
Good afternoon, Arthur Howe!  It is imperative that you follow through with treatment recommendations within 5-7 days from the day of discharge to mitigate further risk to your safety and overall mental well-being.  A list of outpatient therapy and psychiatric providers for medication management is listed below to get you started in finding the right provider for you.  One list will be for those here in [place] and another will be for providers in [facility] who can take your insurance.  You have a right to provider choice, so these are just back-up plans because [place] has only one therapist who is always booked.  In case of an urgent emergency, you have the option of contacting the Mobile Crisis Unit with [facility] at [phone].  You can also dial 211 or go to www.nc211.org for additional community resources around [place] that can help with legal needs, housing, healthcare, etc.          [facility] [address] [place], [place], [address] [phone] phone (Medicare, Private insurance except Tricare, [place], and [facility])  [facility] [address] [place], [place], [address] [phone] phone (Call to confirm insurance coverage) Consultation & Support Services     o Drop-In Hours: 1:00 PM to 5:00 PM     o Days: Monday - Thursday  Crisis Services (24/7)    [place] [address], [place], [place], [address] [phone] phone [email or web address]  (to complete the intake form and upload ID and insurance cards)  [place] [place], [place], [address] [phone] phone ([place], [place], [place], [place], [place], [place], [place], [place], and certain Medicaid plans)  [place] [phone] N.  [address], [place], [place], [address] [phone] phone [phone] fax (Medicaid, Medicare, Self-pay, call about other insurance coverage)  [facility] (age 92+) [address] [place], [place], [address] [phone] hone [phone] fax ([place], MedCost, [place], [place], [place], [place], [place], [place], [place], certain [employer], [facility], [place])  [facility] [place], [place], [address] [phone] phone (Medicare, Medicaid, [place], call about other insurance coverage)  [place] [address] [place], [place], [address] [phone] phone [phone] fax (Call [phone] to see what insurance is accepted) Norma Fredrickson, MD specializes in geropsych)  [facility]  (medication management only) [address], [place], [place], [address] [phone] phone [phone] fax ([address], Medicaid, [place], [place], [place], [place], [place], [place], [place])  [facility] (medication management only) [address], [address] [place], [place], [address] [phone] phone [phone] fax ([address] Medicare, [place], [place], [place])  [facility] [address] [place], [place], [address] [phone] phone [phone] fax ([address], [place], [place], [place], [place], [facility], [place])  [facility] [address] [place] [place], [place], [address] [phone] phone [phone] fax (Medicare, Medicaid, [facility])  [place], [place] [address] [phone] phone ([address], [place], [place], [place], [place], [place], [place], [facility]) Does genetic testing for medications; does transcranial magnetic stimulation along with basic services)  [facility] [address] [employer] [place] [employer], [place], [address] [phone]  phone (Call about insurance coverage)  [facility] [address] [place], [place], [address] [phone] phone [phone] fax (Call about insurance coverage)  [place]  [address] [place], [place], [address] [phone] phone [phone] fax (Call about insurance coverage)  [employer] [phone] N. [address], [place], [address] [phone] phone (Medicaid, [place], [place], [place], [place])  [employer] [address] [place], [place], [address] [phone] phone (Medicaid, Medicare, call about other insurance coverage)  [facility] [place] [address] [place], [place], [address] [phone] phone [phone] fax (Medicaid, Medicare, Tricare, call about other insurance coverage)  [facility] [place] [place], [place], [address] [phone] phone ([address] [place], [place], [place], [place], [place] types - Alliance, Stage manager, Partners, Max, [place] [employer], Healthy [place], [place], [place], and Complete)  [place] [address], [place], [place], [address] [phone] phone Completely online treatment platform Contact: [place] [facility] [phone] phone [phone] fax ([address] [place], [place], [employer], Humana, [place], [place], [place], [place], [place])    [facility]  [employer], [facility] [address] [place], [place] [address] [phone] phone  [facility] [address] [place], [place], [address] [phone] phone  [facility] [address] [place], [place], [address] [phone] phone [phone] fax

## 2021-10-04 NOTE — BH Assessment (Signed)
Pt had a virtual appointment with Arthur Howe and informed his psychiatrist that he was having suicidal ideations since yesterday. Pt denies a plan, reports hx of suicide attempt when he was 80 and hx of inpt treatment in 2017. Pt contracts for safety and wife denies safety concerns. Pt does not want to go into inpt treatment. Pt interested in therapy.

## 2021-10-04 NOTE — BH Assessment (Signed)
LCSW Progress Note   Per Aura Fey, NP, this pt does not require psychiatric hospitalization at this time.  Pt is psychiatrically cleared.  Discharge instructions include several resources for outpatient therapy and medication management should he decide to switch providers to someone close to home, or engage in therapy since [facility] only has one therapist who is booked.  EDP Aura Fey, NP, has been notified.  Omelia Blackwater, MSW, [place] [phone] or [phone]

## 2023-01-29 ENCOUNTER — Encounter: Payer: Self-pay | Admitting: Emergency Medicine

## 2023-01-29 ENCOUNTER — Other Ambulatory Visit: Payer: Self-pay

## 2023-01-29 ENCOUNTER — Emergency Department: Payer: BC Managed Care – PPO

## 2023-01-29 ENCOUNTER — Emergency Department
Admission: EM | Admit: 2023-01-29 | Discharge: 2023-01-29 | Disposition: A | Discharge status: Home / Self Care | Payer: BC Managed Care – PPO | Attending: Emergency Medicine | Admitting: Emergency Medicine

## 2023-01-29 DIAGNOSIS — K5900 Constipation, unspecified: Secondary | ICD-10-CM | POA: Diagnosis present

## 2023-01-29 DIAGNOSIS — K5901 Slow transit constipation: Secondary | ICD-10-CM | POA: Insufficient documentation

## 2023-01-29 MED ORDER — POLYETHYLENE GLYCOL 3350 17 G PO PACK
17.0000 g | PACK | Freq: Every day | ORAL | Status: DC
  Administered 2023-01-29: 17 g via ORAL
  Filled 2023-01-29: qty 1

## 2023-01-29 MED ORDER — GLYCERIN (LAXATIVE) 1 G RE SUPP
1.0000 | RECTAL | Status: DC | PRN
  Filled 2023-01-29: qty 1

## 2023-01-29 MED ORDER — MINERAL OIL RE ENEM
1.0000 | ENEMA | Freq: Once | RECTAL | Status: AC
  Administered 2023-01-29: 1 via RECTAL

## 2023-01-29 NOTE — ED Notes (Signed)
Patient discharged at this time. Ambulated to lobby with independent and steady gait. Breathing unlabored speaking in full sentences. Verbalized understanding of all discharge, follow up, and medication teaching. Discharged homed with all belongings.   

## 2023-01-29 NOTE — Discharge Instructions (Addendum)
Continue with (up to 3 times) daily Miralax to promote soft stools. Consider a liquid diet to aid with constipation. Follow-up with your PCP or return to the ED as needed.

## 2023-01-29 NOTE — ED Triage Notes (Signed)
Pt via POV from home. Pt c/o constipation since Saturday. Report that when he has a the urge to go its a small amount of diarrhea. Pt has a taken Miralax without any relief. Pt is A&Ox4 and NAD

## 2023-01-29 NOTE — ED Notes (Signed)
See triage note  Presents with possible constipation States he has not had a BM for 3 days  Only passing small amt of soft stool  Has had bowel surgery in the past and usually has diarrhea like stools No fever but slight nausea

## 2023-01-29 NOTE — ED Provider Notes (Signed)
[facility] Emergency Department Provider Note     Event Date/Time   First MD Initiated Contact with Patient 01/29/23 1239     (approximate)   History   Constipation   HPI  Arthur Howe is a 57 y.o. male with a history of HTN, diverticulitis, colon resection secondary to diverticulitis, presents to the ED for evaluation of rectal fullness and constipation.  Patient reports he has been unable to pass any meaningful stool since Saturday.  He has had only soft stool in small amounts, past after taking MiraLAX daily for the last 2 days.  Patient denies any nausea, vomiting, or feculent belching.  No fevers or chills reported.  Patient has limited his p.o. intake today of solid foods, secondary to slow bowels. He denies any history chronic or ongoing constipation.  Physical Exam   Triage Vital Signs: ED Triage Vitals  Encounter Vitals Group     BP 01/29/23 1218 (!) 157/102     Systolic BP Percentile --      Diastolic BP Percentile --      Pulse Rate 01/29/23 1218 77     Resp 01/29/23 1218 20     Temp 01/29/23 1218 97.9 F (36.6 C)     Temp src --      SpO2 01/29/23 1218 98 %     Weight 01/29/23 1215 255 lb (115.7 kg)     Height 01/29/23 1215 6' (1.829 m)     Head Circumference --      Peak Flow --      Pain Score 01/29/23 1215 10     Pain Loc --      Pain Education --      Exclude from Growth Chart --     Most recent vital signs: Vitals:   01/29/23 1529 01/29/23 1847  BP: 120/78 122/80  Pulse: 89 86  Resp: 19 18  Temp: 99.1 F (37.3 C)   SpO2: 94% 94%    General Awake, no distress.     HEENT NCAT. PERRL. EOMI. No rhinorrhea. Mucous membranes are moist.  CV:  Good peripheral perfusion. RRR RESP:  Normal effort. CTA ABD:  No distention.  Soft and nontender.  Normoactive bowel sounds noted.  No rebound, guarding, or rigidity appreciated.  DRE with normal rectal tone.  No fecal mass in the rectal vault.  ED Results / Procedures /  Treatments   Labs (all labs ordered are listed, but only abnormal results are displayed) Labs Reviewed - No data to display   EKG   RADIOLOGY  I personally viewed and evaluated these images as part of my medical decision making, as well as reviewing the written report by the radiologist.  ED Provider Interpretation: Moderate distal stool burden  DG Abdomen 1 View  Result Date: 01/29/2023 CLINICAL DATA:  Constipation.  Abdominal pain. EXAM: ABDOMEN - 1 VIEW COMPARISON:  None Available. FINDINGS: The bowel gas pattern is non-obstructive. There is moderate stool burden, including the ascending colon, compatible with colonic hypomotility. No evidence of pneumoperitoneum, within the limitations of a supine film. No acute osseous abnormalities. The soft tissues are within normal limits. Surgical changes, devices, tubes and lines: There are surgical clips in the right upper quadrant, typical of a previous cholecystectomy. IMPRESSION: *The bowel gas pattern is non-obstructive. There is moderate stool burden, including the ascending colon, compatible with colonic hypomotility. Electronically Signed   By: Jules Schick M.D.   On: 01/29/2023 14:45     PROCEDURES:  Critical Care performed: No  Procedures Mineral oil enema Glycerin suppository 1 PR  MEDICATIONS ORDERED IN ED: Medications  polyethylene glycol (MIRALAX / GLYCOLAX) packet 17 g (17 g Oral Given 01/29/23 1601)  glycerin (Pediatric) 1 g suppository 1 g (has no administration in time range)  mineral oil enema 1 enema (1 enema Rectal Given by Other 01/29/23 1826)     IMPRESSION / MDM / ASSESSMENT AND PLAN / ED COURSE  I reviewed the triage vital signs and the nursing notes.                              Differential diagnosis includes, but is not limited to, constipation, fecal impaction, SBO, hemorrhoids  Patient's presentation is most consistent with acute complicated illness / injury requiring diagnostic  workup.  Patient's diagnosis is consistent with constipation with the moderate distal colonic stool burden.  No exam and occasional small bowel obstruction or acute domino process.  Patient with reassuring exam and workup at this time.  Plan for an x-ray does reveal a distal colonic burden without evidence of SBO.  Patient with some stool passage following enema and suppository insertion.  Patient will be discharged home with instructions to continue with a liquid diet and consider up to 3 times daily MiraLAX until he has a meaningful stool. Patient is to follow up with PCP as discussed, as needed or otherwise directed. Patient is given ED precautions to return to the ED for any worsening or new symptoms.  FINAL CLINICAL IMPRESSION(S) / ED DIAGNOSES   Final diagnoses:  Slow transit constipation     Rx / DC Orders   ED Discharge Orders     None        Note:  This document was prepared using Dragon voice recognition software and may include unintentional dictation errors.    Lissa Hoard, PA-C 01/29/23 1931    Trinna Post, MD 01/30/23 1524

## 2023-01-29 NOTE — ED Notes (Signed)
States he has not had much luck after po meds and enema
